# Patient Record
Sex: Male | Born: 1970 | Race: White
Health system: Southern US, Community
[De-identification: ages and names within clinical notes are randomized; demographics above are authoritative.]

---

## 2018-03-20 DIAGNOSIS — M19072 Primary osteoarthritis, left ankle and foot: Secondary | ICD-10-CM | POA: Diagnosis not present

## 2018-03-25 ENCOUNTER — Other Ambulatory Visit: Payer: Self-pay | Admitting: Orthopaedic Surgery

## 2018-03-25 DIAGNOSIS — M19072 Primary osteoarthritis, left ankle and foot: Secondary | ICD-10-CM

## 2018-03-29 ENCOUNTER — Ambulatory Visit
Admission: RE | Admit: 2018-03-29 | Discharge: 2018-03-29 | Disposition: A | Discharge status: Home / Self Care | Payer: BLUE CROSS/BLUE SHIELD | Source: Ambulatory Visit | Attending: Orthopaedic Surgery | Admitting: Orthopaedic Surgery

## 2018-03-29 DIAGNOSIS — M19072 Primary osteoarthritis, left ankle and foot: Secondary | ICD-10-CM | POA: Diagnosis not present

## 2018-04-15 DIAGNOSIS — M19072 Primary osteoarthritis, left ankle and foot: Secondary | ICD-10-CM | POA: Diagnosis not present

## 2019-01-02 IMAGING — CT CT ANKLE*L* W/O CM
1 series · 12 of 14 positions shown, 15 images · non-contrast
Comparison: None.

CLINICAL DATA: Left ankle pain for 3 years. No known injury. The
patient reports history of surgery for bone spurs and tendon repair
in 7469.

EXAM:
CT OF THE LEFT ANKLE WITHOUT CONTRAST
TECHNIQUE: Multidetector CT imaging of the left ankle was performed according
to the standard protocol. Multiplanar CT image reconstructions were
also generated.

[Series 4: soft tissue lower extremity · axial · 0.34mm/px · z∈[+36,+186]mm · 12 of 89 slices shown, 15 images]
[im 7/89  soft-tissue]
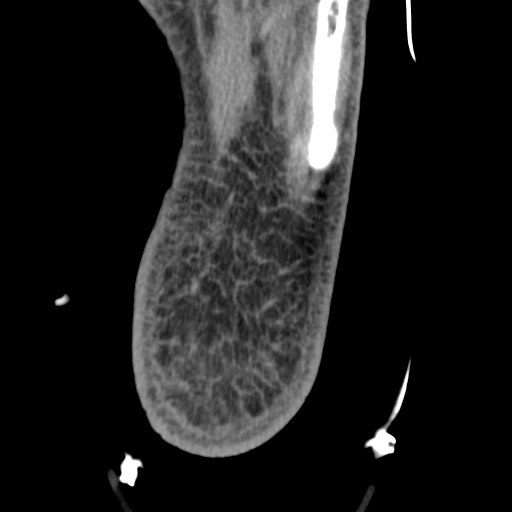
[im 7/89  bone]
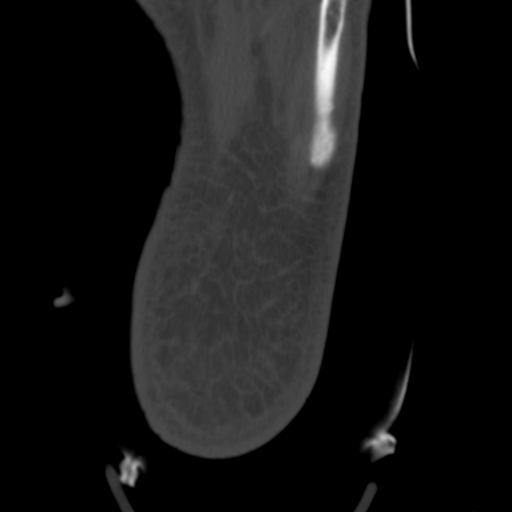
[im 14/89  bone]
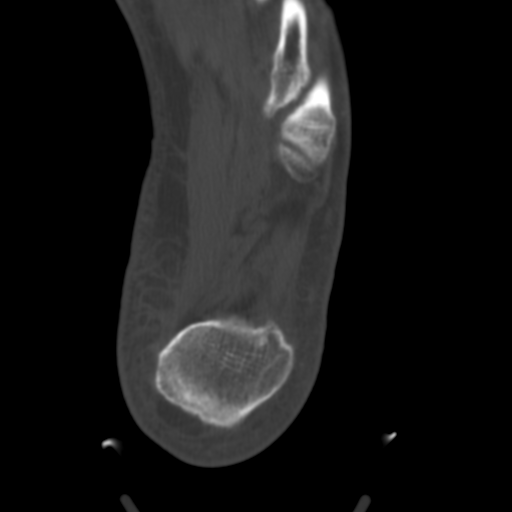
[im 21/89  bone]
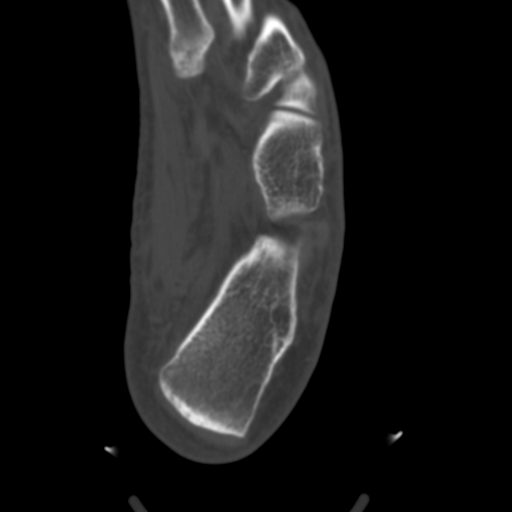
[im 28/89  bone]
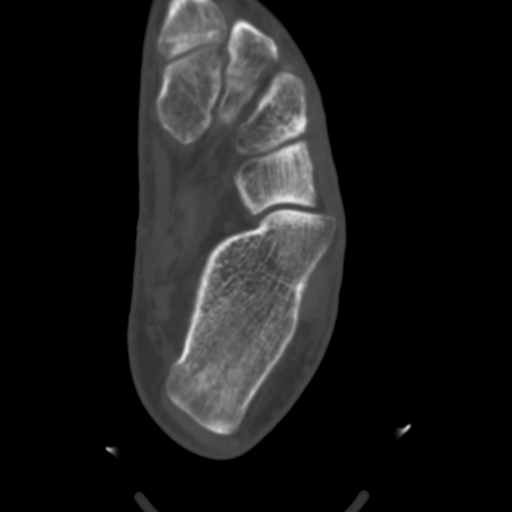
[im 34/89  soft-tissue]
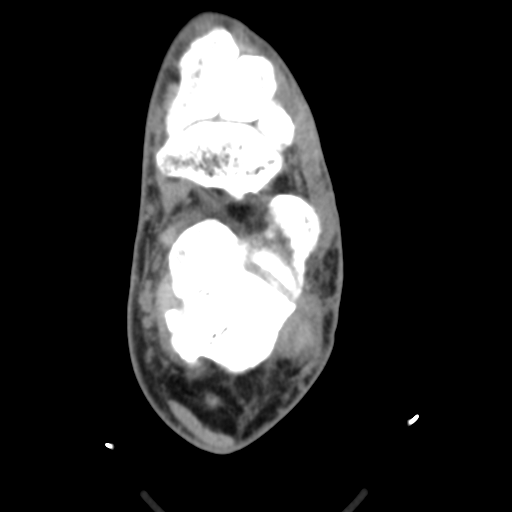
[im 34/89  bone]
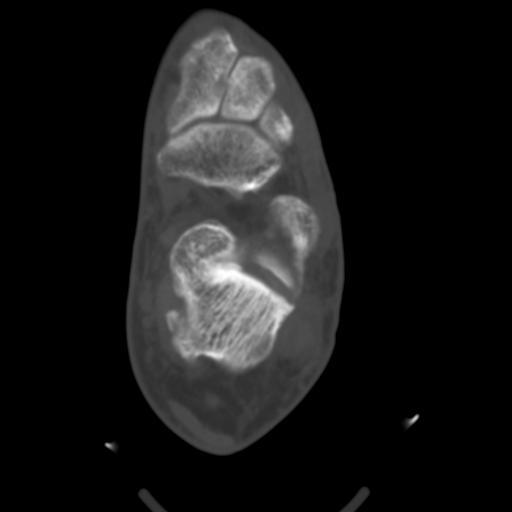
[im 41/89  bone]
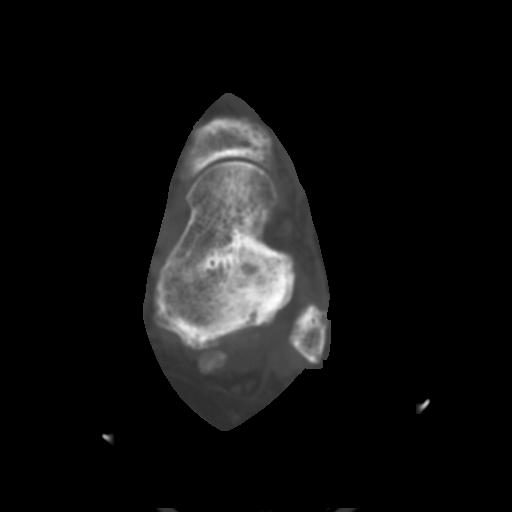
[im 48/89  bone]
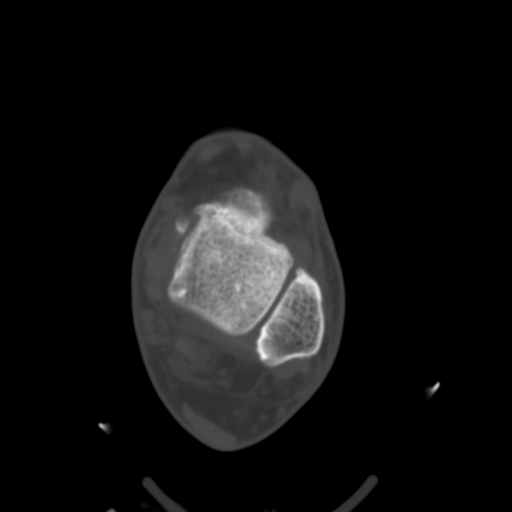
[im 55/89  bone]
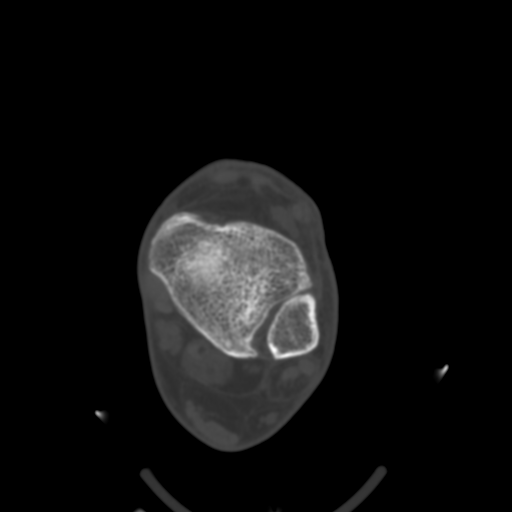
[im 61/89  soft-tissue]
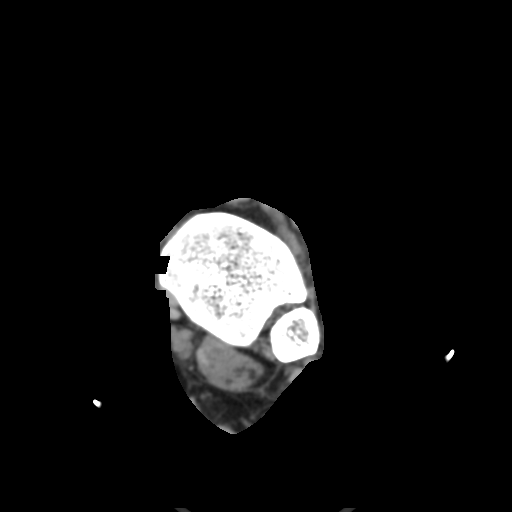
[im 61/89  bone]
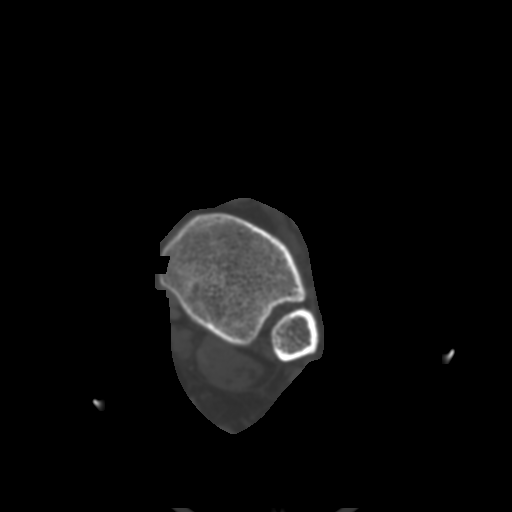
[im 68/89  bone]
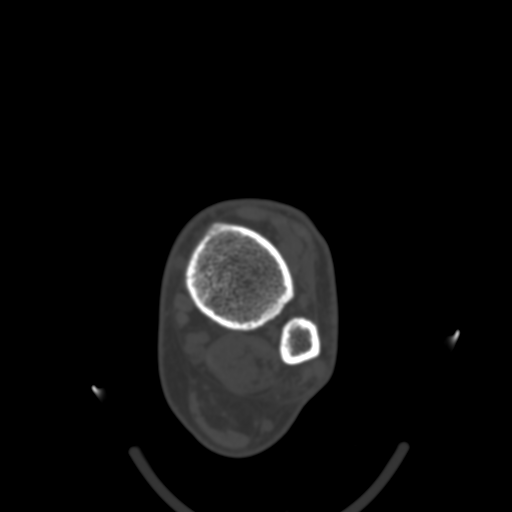
[im 75/89  bone]
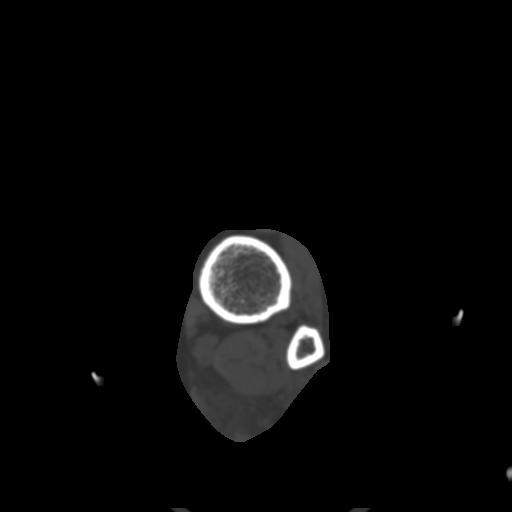
[im 82/89  bone]
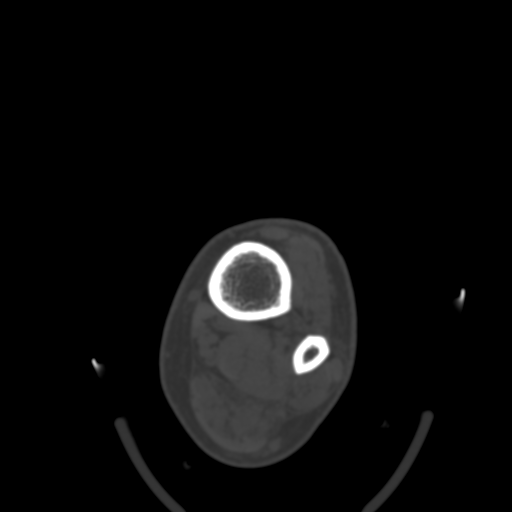

[12 of 14 positions shown; findings below may reference images not displayed]

FINDINGS: Bones/Joint/Cartilage

No acute bony or joint abnormality is seen. The patient has an os
trigonum which measures up to 1 cm AP x 0.6 cm craniocaudal x 2.6 cm
transverse. Medial to the os, the dorsal process of the calcaneus is
atypically prominent. The patient's os trigonum and prominent dorsal
process of the talus articulate with an exostosis off the dorsal
margin of the calcaneus.

The patient has degenerative disease at the posterior facet of the
subtalar joint where there is joint space narrowing, subchondral
sclerosis and a few small subchondral cysts in the talar side of the
posterior facet.

Minimal osteophytosis is seen about the tibiotalar joint. A small
spur of the calcaneus at the Achilles tendon insertion is also
noted. No osteochondral lesion of the talar dome is identified.
There is no tarsal coalition.

Ligaments

Suboptimally assessed by CT.  Appear intact.

Muscles and Tendons

The peroneus longus tendon appears thickened posterior to the
lateral malleolus with hypoattenuation within the mid substance of
the tendon as it passes subjacent to the lateral malleolus worrisome
for longitudinal split tear. Tendons are otherwise unremarkable.

Soft tissues

No fluid collection or mass.
IMPRESSION: Os trigonum and prominent dorsal medial process of the talus
articulate with a small exostosis off the calcaneus. This could be a
source of posterior ankle pain also raise the possibility of
posterior ankle impingement.

Findings worrisome for longitudinal split tear of the peroneus
longus as it passes beneath the distal fibula.

Moderate osteoarthritis posterior facet of the subtalar joint.

## 2019-02-11 DIAGNOSIS — M25572 Pain in left ankle and joints of left foot: Secondary | ICD-10-CM | POA: Diagnosis not present

## 2019-02-11 DIAGNOSIS — Z13220 Encounter for screening for lipoid disorders: Secondary | ICD-10-CM | POA: Diagnosis not present

## 2019-02-11 DIAGNOSIS — M1009 Idiopathic gout, multiple sites: Secondary | ICD-10-CM | POA: Diagnosis not present

## 2019-02-11 DIAGNOSIS — Z23 Encounter for immunization: Secondary | ICD-10-CM | POA: Diagnosis not present

## 2019-02-11 DIAGNOSIS — Z0000 Encounter for general adult medical examination without abnormal findings: Secondary | ICD-10-CM | POA: Diagnosis not present

## 2019-08-19 DIAGNOSIS — D1801 Hemangioma of skin and subcutaneous tissue: Secondary | ICD-10-CM | POA: Diagnosis not present

## 2019-08-19 DIAGNOSIS — D225 Melanocytic nevi of trunk: Secondary | ICD-10-CM | POA: Diagnosis not present

## 2019-08-19 DIAGNOSIS — L821 Other seborrheic keratosis: Secondary | ICD-10-CM | POA: Diagnosis not present

## 2019-08-19 DIAGNOSIS — L218 Other seborrheic dermatitis: Secondary | ICD-10-CM | POA: Diagnosis not present

## 2019-10-20 DIAGNOSIS — J029 Acute pharyngitis, unspecified: Secondary | ICD-10-CM | POA: Diagnosis not present

## 2019-10-20 DIAGNOSIS — H40003 Preglaucoma, unspecified, bilateral: Secondary | ICD-10-CM | POA: Diagnosis not present

## 2019-11-11 DIAGNOSIS — R634 Abnormal weight loss: Secondary | ICD-10-CM | POA: Diagnosis not present

## 2019-11-11 DIAGNOSIS — Z23 Encounter for immunization: Secondary | ICD-10-CM | POA: Diagnosis not present

## 2019-11-11 DIAGNOSIS — K120 Recurrent oral aphthae: Secondary | ICD-10-CM | POA: Diagnosis not present

## 2019-12-08 DIAGNOSIS — J069 Acute upper respiratory infection, unspecified: Secondary | ICD-10-CM | POA: Diagnosis not present

## 2019-12-09 DIAGNOSIS — R05 Cough: Secondary | ICD-10-CM | POA: Diagnosis not present

## 2019-12-09 DIAGNOSIS — Z03818 Encounter for observation for suspected exposure to other biological agents ruled out: Secondary | ICD-10-CM | POA: Diagnosis not present

## 2019-12-24 DIAGNOSIS — J0110 Acute frontal sinusitis, unspecified: Secondary | ICD-10-CM | POA: Diagnosis not present

## 2020-02-04 DIAGNOSIS — J329 Chronic sinusitis, unspecified: Secondary | ICD-10-CM | POA: Diagnosis not present

## 2020-02-10 ENCOUNTER — Ambulatory Visit: Payer: BLUE CROSS/BLUE SHIELD | Admitting: Sports Medicine

## 2020-02-11 ENCOUNTER — Ambulatory Visit: Payer: Self-pay

## 2020-02-11 ENCOUNTER — Other Ambulatory Visit: Payer: Self-pay

## 2020-02-11 ENCOUNTER — Ambulatory Visit (INDEPENDENT_AMBULATORY_CARE_PROVIDER_SITE_OTHER): Payer: BC Managed Care – PPO | Admitting: Family Medicine

## 2020-02-11 VITALS — BP 118/80 | Ht 75.0 in | Wt 210.0 lb

## 2020-02-11 DIAGNOSIS — M25572 Pain in left ankle and joints of left foot: Secondary | ICD-10-CM

## 2020-02-11 DIAGNOSIS — G8929 Other chronic pain: Secondary | ICD-10-CM

## 2020-02-11 NOTE — Progress Notes (Addendum)
SUBJECTIVE:   CHIEF COMPLAINT / HPI:   Left Ankle/Foot Pain [name] is a very pleasant 49 year old male who is a new patient to this clinic who presents today for chronic lateral ankle pain.  He states this began many years ago when he first lived in [place] he was found to have calcifications and tears in his peroneal tendons.  He had this surgically repaired at that time and was quickly released to return to running by the physician in [place] in 2017.  He thought perhaps he returned to running too soon and did do his own physical therapy and rest with some minor improvement.  He moved to [place] in 2019 and his symptoms returned so he did get an appointment to be seen by Dr. Susa Simmonds at [facility] orthopedics about 1 year ago.  He said he got some imaging and per his remembrance with his discussion with Dr. Darlyn Chamber he was given the option to try some orthotics versus undergo a surgical procedure which would involve fusion of 2 bones.  He never heard back from [facility] orthopedics on when to come in for orthotics so he contacted his PCP Dr. Chanetta Marshall and she referred him to [place].  Today he states the lateral ankle pain is not on the bone but behind the bone and radiates somewhat up the back of the leg and is worse in the morning or when he let his foot hanging down.  It is intermittent and comes and goes but does seem to get worse with running and activity.  He states that if he plays soccer with his kids in the yard he knows he will have to "pay for it" later on.  He has been doing some rowing to stay active and as this puts less stress on his ankle he can tolerate it more.  PERTINENT  PMH / PSH: History of left peroneal tendon surgery  OBJECTIVE:   BP 118/80   Ht 6\' 3"  (1.905 m)   Wt 210 lb (95.3 kg)   BMI 26.25 kg/m   [facility] 02/11/2020  Frequency of aerobic exercise (# of days/week) 5  Average time in minutes 45  Frequency of strengthening  activities (# of days/week) 3   Ankle/Foot, Left: No visible erythema, swelling, ecchymosis, or bony deformity. No notable pes planus deformity. Transverse arch grossly intact; No evidence of tibiotalar deviation; Range of motion is full in all directions but does have stiffness and mild pain with ankle inversion. Strength is 5/5 in all directions. No tenderness at the insertion of the Achilles tendon; Mild peroneal tendon tenderness to palpation but no subluxation; Stable lateral and medial ligaments;Talar dome nontender; Unremarkable calcaneal squeeze; No tenderness at the distal metatarsals; Negative tarsal tunnel tinel's; Able to walk 4 steps.  Special Tests:   - Anterior Drawer test: NEG   - Talar Tilt test: NEG   - Syndesmotic Squeeze test: NEG  Limited [facility] ultrasound left ankle Peroneus brevis and longus both viewed in short and long axis.  It had evidence of hypoechoic changes and surrounding fluid within the tendon sheath suggestive of tenosynovitis.  Small hyper echoic density seen surrounding the peroneus brevis just proximal to the insertion at the base of the fifth metatarsal.  Indicating calcific changes.  No fiber disruption or stump sign present. 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   ASSESSMENT/PLAN:   Left ankle pain Patient with  chronic lateral ankle pain status post peroneus tendon surgery from 2017.  His history, clinical presentation, and ultrasound results today consistent with chronic tendinopathy of the peroneus tendons.  No evidence of tearing but he does have some evidence of swelling surrounding the tendon within the sheath.  We will begin treatment conservatively and patient was given green insoles with scaphoid pads to see if orthotics would be of a possible benefit to him.  Also gave him home exercises to strengthen the peroneus tendons and also some range  of motion exercises for the ankle.  I will have him follow-up in 2 weeks to discuss next steps.  If he gets a benefit from the green insoles we can do orthotics or he can decide to keep those if he is happy with them.  If he gets better with neither we could go with another session of formal physical therapy.  Icing, OTC nsaids or tylenol if needed.    Arlyce Harman, DO PGY-4, Sports Medicine Fellow [facility]  Addendum:  Patient seen in the office by fellow.  His history, exam, plan of care were precepted with me.  Norton Blizzard MD Marrianne Mood

## 2020-02-11 NOTE — Assessment & Plan Note (Addendum)
Patient with chronic lateral ankle pain status post peroneus tendon surgery from 2017.  Given his history, clinical presentation, and ultrasound results today he most likely has chronic tendinopathy of the peroneus tendon.  No evidence of tearing but he does have some evidence of swelling surrounding the tendon within the sheath.  We will begin treatment conservatively and patient was given green insoles with scaphoid pads to see if orthotics would be of a possible benefit to him.  Also gave him home exercises to strengthen the peroneus tendons and also some range of motion exercises for the ankle.  I will have him follow-up in 2 weeks to discuss next steps.  If he gets a benefit from the green insoles we can do orthotics or he can decide to keep those if he is happy with them.  If he gets better with neither we could go with another session of formal physical therapy.

## 2020-02-16 DIAGNOSIS — M1009 Idiopathic gout, multiple sites: Secondary | ICD-10-CM | POA: Diagnosis not present

## 2020-02-16 DIAGNOSIS — E785 Hyperlipidemia, unspecified: Secondary | ICD-10-CM | POA: Diagnosis not present

## 2020-02-16 DIAGNOSIS — Z0000 Encounter for general adult medical examination without abnormal findings: Secondary | ICD-10-CM | POA: Diagnosis not present

## 2020-02-18 DIAGNOSIS — D1801 Hemangioma of skin and subcutaneous tissue: Secondary | ICD-10-CM | POA: Diagnosis not present

## 2020-02-18 DIAGNOSIS — Z872 Personal history of diseases of the skin and subcutaneous tissue: Secondary | ICD-10-CM | POA: Diagnosis not present

## 2020-02-18 DIAGNOSIS — L905 Scar conditions and fibrosis of skin: Secondary | ICD-10-CM | POA: Diagnosis not present

## 2020-02-18 DIAGNOSIS — L218 Other seborrheic dermatitis: Secondary | ICD-10-CM | POA: Diagnosis not present

## 2020-03-01 DIAGNOSIS — J309 Allergic rhinitis, unspecified: Secondary | ICD-10-CM | POA: Diagnosis not present

## 2020-03-01 DIAGNOSIS — J329 Chronic sinusitis, unspecified: Secondary | ICD-10-CM | POA: Diagnosis not present

## 2020-03-01 DIAGNOSIS — J3489 Other specified disorders of nose and nasal sinuses: Secondary | ICD-10-CM | POA: Diagnosis not present

## 2020-03-25 DIAGNOSIS — Z01818 Encounter for other preprocedural examination: Secondary | ICD-10-CM | POA: Diagnosis not present

## 2020-03-30 DIAGNOSIS — R040 Epistaxis: Secondary | ICD-10-CM | POA: Diagnosis not present

## 2020-03-30 DIAGNOSIS — J301 Allergic rhinitis due to pollen: Secondary | ICD-10-CM | POA: Diagnosis not present

## 2020-03-30 DIAGNOSIS — J3089 Other allergic rhinitis: Secondary | ICD-10-CM | POA: Diagnosis not present

## 2020-03-31 DIAGNOSIS — J301 Allergic rhinitis due to pollen: Secondary | ICD-10-CM | POA: Diagnosis not present

## 2020-04-01 DIAGNOSIS — J3089 Other allergic rhinitis: Secondary | ICD-10-CM | POA: Diagnosis not present

## 2020-04-20 DIAGNOSIS — J301 Allergic rhinitis due to pollen: Secondary | ICD-10-CM | POA: Diagnosis not present

## 2020-04-20 DIAGNOSIS — J3089 Other allergic rhinitis: Secondary | ICD-10-CM | POA: Diagnosis not present

## 2020-04-22 DIAGNOSIS — J301 Allergic rhinitis due to pollen: Secondary | ICD-10-CM | POA: Diagnosis not present

## 2020-04-22 DIAGNOSIS — J3089 Other allergic rhinitis: Secondary | ICD-10-CM | POA: Diagnosis not present

## 2020-04-26 DIAGNOSIS — J301 Allergic rhinitis due to pollen: Secondary | ICD-10-CM | POA: Diagnosis not present

## 2020-04-26 DIAGNOSIS — J3089 Other allergic rhinitis: Secondary | ICD-10-CM | POA: Diagnosis not present

## 2020-04-28 DIAGNOSIS — J301 Allergic rhinitis due to pollen: Secondary | ICD-10-CM | POA: Diagnosis not present

## 2020-04-28 DIAGNOSIS — J3089 Other allergic rhinitis: Secondary | ICD-10-CM | POA: Diagnosis not present

## 2020-04-30 DIAGNOSIS — J301 Allergic rhinitis due to pollen: Secondary | ICD-10-CM | POA: Diagnosis not present

## 2020-04-30 DIAGNOSIS — J3089 Other allergic rhinitis: Secondary | ICD-10-CM | POA: Diagnosis not present

## 2020-05-03 DIAGNOSIS — J301 Allergic rhinitis due to pollen: Secondary | ICD-10-CM | POA: Diagnosis not present

## 2020-05-03 DIAGNOSIS — J3089 Other allergic rhinitis: Secondary | ICD-10-CM | POA: Diagnosis not present

## 2020-05-05 DIAGNOSIS — J3089 Other allergic rhinitis: Secondary | ICD-10-CM | POA: Diagnosis not present

## 2020-05-05 DIAGNOSIS — J301 Allergic rhinitis due to pollen: Secondary | ICD-10-CM | POA: Diagnosis not present

## 2020-05-07 DIAGNOSIS — J3081 Allergic rhinitis due to animal (cat) (dog) hair and dander: Secondary | ICD-10-CM | POA: Diagnosis not present

## 2020-05-07 DIAGNOSIS — J301 Allergic rhinitis due to pollen: Secondary | ICD-10-CM | POA: Diagnosis not present

## 2020-05-07 DIAGNOSIS — J3089 Other allergic rhinitis: Secondary | ICD-10-CM | POA: Diagnosis not present

## 2020-05-11 DIAGNOSIS — J301 Allergic rhinitis due to pollen: Secondary | ICD-10-CM | POA: Diagnosis not present

## 2020-05-11 DIAGNOSIS — J3089 Other allergic rhinitis: Secondary | ICD-10-CM | POA: Diagnosis not present

## 2020-05-13 DIAGNOSIS — J301 Allergic rhinitis due to pollen: Secondary | ICD-10-CM | POA: Diagnosis not present

## 2020-05-13 DIAGNOSIS — J3089 Other allergic rhinitis: Secondary | ICD-10-CM | POA: Diagnosis not present

## 2020-05-17 DIAGNOSIS — J301 Allergic rhinitis due to pollen: Secondary | ICD-10-CM | POA: Diagnosis not present

## 2020-05-17 DIAGNOSIS — J3089 Other allergic rhinitis: Secondary | ICD-10-CM | POA: Diagnosis not present

## 2020-05-19 DIAGNOSIS — J3089 Other allergic rhinitis: Secondary | ICD-10-CM | POA: Diagnosis not present

## 2020-05-19 DIAGNOSIS — J301 Allergic rhinitis due to pollen: Secondary | ICD-10-CM | POA: Diagnosis not present

## 2020-05-19 DIAGNOSIS — J3081 Allergic rhinitis due to animal (cat) (dog) hair and dander: Secondary | ICD-10-CM | POA: Diagnosis not present

## 2020-05-21 DIAGNOSIS — J3089 Other allergic rhinitis: Secondary | ICD-10-CM | POA: Diagnosis not present

## 2020-05-21 DIAGNOSIS — J301 Allergic rhinitis due to pollen: Secondary | ICD-10-CM | POA: Diagnosis not present

## 2020-05-24 DIAGNOSIS — J3089 Other allergic rhinitis: Secondary | ICD-10-CM | POA: Diagnosis not present

## 2020-05-24 DIAGNOSIS — J301 Allergic rhinitis due to pollen: Secondary | ICD-10-CM | POA: Diagnosis not present

## 2020-05-26 DIAGNOSIS — J301 Allergic rhinitis due to pollen: Secondary | ICD-10-CM | POA: Diagnosis not present

## 2020-05-26 DIAGNOSIS — J3089 Other allergic rhinitis: Secondary | ICD-10-CM | POA: Diagnosis not present

## 2020-05-31 DIAGNOSIS — J3089 Other allergic rhinitis: Secondary | ICD-10-CM | POA: Diagnosis not present

## 2020-05-31 DIAGNOSIS — Z01812 Encounter for preprocedural laboratory examination: Secondary | ICD-10-CM | POA: Diagnosis not present

## 2020-05-31 DIAGNOSIS — J301 Allergic rhinitis due to pollen: Secondary | ICD-10-CM | POA: Diagnosis not present

## 2020-06-03 DIAGNOSIS — K648 Other hemorrhoids: Secondary | ICD-10-CM | POA: Diagnosis not present

## 2020-06-03 DIAGNOSIS — K621 Rectal polyp: Secondary | ICD-10-CM | POA: Diagnosis not present

## 2020-06-03 DIAGNOSIS — Z1211 Encounter for screening for malignant neoplasm of colon: Secondary | ICD-10-CM | POA: Diagnosis not present

## 2020-06-03 DIAGNOSIS — K635 Polyp of colon: Secondary | ICD-10-CM | POA: Diagnosis not present

## 2020-06-03 DIAGNOSIS — D127 Benign neoplasm of rectosigmoid junction: Secondary | ICD-10-CM | POA: Diagnosis not present

## 2020-06-03 DIAGNOSIS — D123 Benign neoplasm of transverse colon: Secondary | ICD-10-CM | POA: Diagnosis not present

## 2020-06-04 DIAGNOSIS — J3089 Other allergic rhinitis: Secondary | ICD-10-CM | POA: Diagnosis not present

## 2020-06-04 DIAGNOSIS — J301 Allergic rhinitis due to pollen: Secondary | ICD-10-CM | POA: Diagnosis not present

## 2020-06-07 DIAGNOSIS — J3089 Other allergic rhinitis: Secondary | ICD-10-CM | POA: Diagnosis not present

## 2020-06-07 DIAGNOSIS — J301 Allergic rhinitis due to pollen: Secondary | ICD-10-CM | POA: Diagnosis not present

## 2020-06-11 DIAGNOSIS — J3089 Other allergic rhinitis: Secondary | ICD-10-CM | POA: Diagnosis not present

## 2020-06-11 DIAGNOSIS — J301 Allergic rhinitis due to pollen: Secondary | ICD-10-CM | POA: Diagnosis not present

## 2020-06-15 DIAGNOSIS — J3089 Other allergic rhinitis: Secondary | ICD-10-CM | POA: Diagnosis not present

## 2020-06-15 DIAGNOSIS — J301 Allergic rhinitis due to pollen: Secondary | ICD-10-CM | POA: Diagnosis not present

## 2020-06-17 DIAGNOSIS — J3081 Allergic rhinitis due to animal (cat) (dog) hair and dander: Secondary | ICD-10-CM | POA: Diagnosis not present

## 2020-06-17 DIAGNOSIS — J3089 Other allergic rhinitis: Secondary | ICD-10-CM | POA: Diagnosis not present

## 2020-06-17 DIAGNOSIS — J301 Allergic rhinitis due to pollen: Secondary | ICD-10-CM | POA: Diagnosis not present

## 2020-06-21 DIAGNOSIS — J3089 Other allergic rhinitis: Secondary | ICD-10-CM | POA: Diagnosis not present

## 2020-06-21 DIAGNOSIS — J301 Allergic rhinitis due to pollen: Secondary | ICD-10-CM | POA: Diagnosis not present

## 2020-06-29 DIAGNOSIS — J3089 Other allergic rhinitis: Secondary | ICD-10-CM | POA: Diagnosis not present

## 2020-06-29 DIAGNOSIS — J301 Allergic rhinitis due to pollen: Secondary | ICD-10-CM | POA: Diagnosis not present

## 2020-07-07 DIAGNOSIS — J301 Allergic rhinitis due to pollen: Secondary | ICD-10-CM | POA: Diagnosis not present

## 2020-07-07 DIAGNOSIS — J3089 Other allergic rhinitis: Secondary | ICD-10-CM | POA: Diagnosis not present

## 2020-07-16 DIAGNOSIS — J3089 Other allergic rhinitis: Secondary | ICD-10-CM | POA: Diagnosis not present

## 2020-07-16 DIAGNOSIS — J301 Allergic rhinitis due to pollen: Secondary | ICD-10-CM | POA: Diagnosis not present

## 2020-07-23 DIAGNOSIS — J301 Allergic rhinitis due to pollen: Secondary | ICD-10-CM | POA: Diagnosis not present

## 2020-07-23 DIAGNOSIS — J3089 Other allergic rhinitis: Secondary | ICD-10-CM | POA: Diagnosis not present

## 2020-08-05 DIAGNOSIS — J301 Allergic rhinitis due to pollen: Secondary | ICD-10-CM | POA: Diagnosis not present

## 2020-08-05 DIAGNOSIS — J3089 Other allergic rhinitis: Secondary | ICD-10-CM | POA: Diagnosis not present

## 2020-08-18 DIAGNOSIS — J3089 Other allergic rhinitis: Secondary | ICD-10-CM | POA: Diagnosis not present

## 2020-08-18 DIAGNOSIS — J3081 Allergic rhinitis due to animal (cat) (dog) hair and dander: Secondary | ICD-10-CM | POA: Diagnosis not present

## 2020-08-18 DIAGNOSIS — J301 Allergic rhinitis due to pollen: Secondary | ICD-10-CM | POA: Diagnosis not present

## 2020-08-25 DIAGNOSIS — J3089 Other allergic rhinitis: Secondary | ICD-10-CM | POA: Diagnosis not present

## 2020-08-25 DIAGNOSIS — J301 Allergic rhinitis due to pollen: Secondary | ICD-10-CM | POA: Diagnosis not present

## 2020-08-31 DIAGNOSIS — J301 Allergic rhinitis due to pollen: Secondary | ICD-10-CM | POA: Diagnosis not present

## 2020-08-31 DIAGNOSIS — J3089 Other allergic rhinitis: Secondary | ICD-10-CM | POA: Diagnosis not present

## 2020-09-06 DIAGNOSIS — Z1159 Encounter for screening for other viral diseases: Secondary | ICD-10-CM | POA: Diagnosis not present

## 2020-09-07 DIAGNOSIS — J301 Allergic rhinitis due to pollen: Secondary | ICD-10-CM | POA: Diagnosis not present

## 2020-09-07 DIAGNOSIS — J3089 Other allergic rhinitis: Secondary | ICD-10-CM | POA: Diagnosis not present

## 2020-09-15 DIAGNOSIS — J301 Allergic rhinitis due to pollen: Secondary | ICD-10-CM | POA: Diagnosis not present

## 2020-09-15 DIAGNOSIS — J3089 Other allergic rhinitis: Secondary | ICD-10-CM | POA: Diagnosis not present

## 2020-09-28 DIAGNOSIS — J3089 Other allergic rhinitis: Secondary | ICD-10-CM | POA: Diagnosis not present

## 2020-09-28 DIAGNOSIS — J301 Allergic rhinitis due to pollen: Secondary | ICD-10-CM | POA: Diagnosis not present

## 2020-10-05 DIAGNOSIS — J301 Allergic rhinitis due to pollen: Secondary | ICD-10-CM | POA: Diagnosis not present

## 2020-10-05 DIAGNOSIS — J3089 Other allergic rhinitis: Secondary | ICD-10-CM | POA: Diagnosis not present

## 2020-10-08 DIAGNOSIS — H66002 Acute suppurative otitis media without spontaneous rupture of ear drum, left ear: Secondary | ICD-10-CM | POA: Diagnosis not present

## 2020-10-20 DIAGNOSIS — H40013 Open angle with borderline findings, low risk, bilateral: Secondary | ICD-10-CM | POA: Diagnosis not present

## 2020-10-22 DIAGNOSIS — J301 Allergic rhinitis due to pollen: Secondary | ICD-10-CM | POA: Diagnosis not present

## 2020-10-22 DIAGNOSIS — J3089 Other allergic rhinitis: Secondary | ICD-10-CM | POA: Diagnosis not present

## 2020-10-29 DIAGNOSIS — J3089 Other allergic rhinitis: Secondary | ICD-10-CM | POA: Diagnosis not present

## 2020-10-29 DIAGNOSIS — R040 Epistaxis: Secondary | ICD-10-CM | POA: Diagnosis not present

## 2020-10-29 DIAGNOSIS — J301 Allergic rhinitis due to pollen: Secondary | ICD-10-CM | POA: Diagnosis not present

## 2020-11-01 DIAGNOSIS — J3089 Other allergic rhinitis: Secondary | ICD-10-CM | POA: Diagnosis not present

## 2020-11-01 DIAGNOSIS — J301 Allergic rhinitis due to pollen: Secondary | ICD-10-CM | POA: Diagnosis not present

## 2020-11-03 DIAGNOSIS — J3089 Other allergic rhinitis: Secondary | ICD-10-CM | POA: Diagnosis not present

## 2020-11-03 DIAGNOSIS — J301 Allergic rhinitis due to pollen: Secondary | ICD-10-CM | POA: Diagnosis not present

## 2020-11-10 DIAGNOSIS — J3089 Other allergic rhinitis: Secondary | ICD-10-CM | POA: Diagnosis not present

## 2020-11-10 DIAGNOSIS — J301 Allergic rhinitis due to pollen: Secondary | ICD-10-CM | POA: Diagnosis not present

## 2020-11-16 DIAGNOSIS — M1009 Idiopathic gout, multiple sites: Secondary | ICD-10-CM | POA: Diagnosis not present

## 2020-11-16 DIAGNOSIS — R55 Syncope and collapse: Secondary | ICD-10-CM | POA: Diagnosis not present

## 2020-11-16 DIAGNOSIS — J301 Allergic rhinitis due to pollen: Secondary | ICD-10-CM | POA: Diagnosis not present

## 2020-11-16 DIAGNOSIS — J3089 Other allergic rhinitis: Secondary | ICD-10-CM | POA: Diagnosis not present

## 2020-11-30 DIAGNOSIS — J301 Allergic rhinitis due to pollen: Secondary | ICD-10-CM | POA: Diagnosis not present

## 2020-11-30 DIAGNOSIS — J3081 Allergic rhinitis due to animal (cat) (dog) hair and dander: Secondary | ICD-10-CM | POA: Diagnosis not present

## 2020-11-30 DIAGNOSIS — J3089 Other allergic rhinitis: Secondary | ICD-10-CM | POA: Diagnosis not present

## 2020-12-09 DIAGNOSIS — J3089 Other allergic rhinitis: Secondary | ICD-10-CM | POA: Diagnosis not present

## 2020-12-09 DIAGNOSIS — J301 Allergic rhinitis due to pollen: Secondary | ICD-10-CM | POA: Diagnosis not present

## 2020-12-10 DIAGNOSIS — J301 Allergic rhinitis due to pollen: Secondary | ICD-10-CM | POA: Diagnosis not present

## 2020-12-10 DIAGNOSIS — J3089 Other allergic rhinitis: Secondary | ICD-10-CM | POA: Diagnosis not present

## 2020-12-15 DIAGNOSIS — J301 Allergic rhinitis due to pollen: Secondary | ICD-10-CM | POA: Diagnosis not present

## 2020-12-15 DIAGNOSIS — J3089 Other allergic rhinitis: Secondary | ICD-10-CM | POA: Diagnosis not present

## 2020-12-21 DIAGNOSIS — J3089 Other allergic rhinitis: Secondary | ICD-10-CM | POA: Diagnosis not present

## 2020-12-21 DIAGNOSIS — J301 Allergic rhinitis due to pollen: Secondary | ICD-10-CM | POA: Diagnosis not present

## 2020-12-30 DIAGNOSIS — J3089 Other allergic rhinitis: Secondary | ICD-10-CM | POA: Diagnosis not present

## 2020-12-30 DIAGNOSIS — J301 Allergic rhinitis due to pollen: Secondary | ICD-10-CM | POA: Diagnosis not present

## 2021-01-03 DIAGNOSIS — J3089 Other allergic rhinitis: Secondary | ICD-10-CM | POA: Diagnosis not present

## 2021-01-03 DIAGNOSIS — J301 Allergic rhinitis due to pollen: Secondary | ICD-10-CM | POA: Diagnosis not present

## 2021-01-05 DIAGNOSIS — J301 Allergic rhinitis due to pollen: Secondary | ICD-10-CM | POA: Diagnosis not present

## 2021-01-05 DIAGNOSIS — J3089 Other allergic rhinitis: Secondary | ICD-10-CM | POA: Diagnosis not present

## 2021-01-05 DIAGNOSIS — M1009 Idiopathic gout, multiple sites: Secondary | ICD-10-CM | POA: Diagnosis not present

## 2021-01-05 DIAGNOSIS — Z23 Encounter for immunization: Secondary | ICD-10-CM | POA: Diagnosis not present

## 2021-01-07 DIAGNOSIS — J3089 Other allergic rhinitis: Secondary | ICD-10-CM | POA: Diagnosis not present

## 2021-01-07 DIAGNOSIS — J301 Allergic rhinitis due to pollen: Secondary | ICD-10-CM | POA: Diagnosis not present

## 2021-01-10 DIAGNOSIS — J3089 Other allergic rhinitis: Secondary | ICD-10-CM | POA: Diagnosis not present

## 2021-01-10 DIAGNOSIS — J301 Allergic rhinitis due to pollen: Secondary | ICD-10-CM | POA: Diagnosis not present

## 2021-01-12 DIAGNOSIS — J3089 Other allergic rhinitis: Secondary | ICD-10-CM | POA: Diagnosis not present

## 2021-01-12 DIAGNOSIS — J301 Allergic rhinitis due to pollen: Secondary | ICD-10-CM | POA: Diagnosis not present

## 2021-01-24 DIAGNOSIS — J301 Allergic rhinitis due to pollen: Secondary | ICD-10-CM | POA: Diagnosis not present

## 2021-01-24 DIAGNOSIS — J3089 Other allergic rhinitis: Secondary | ICD-10-CM | POA: Diagnosis not present

## 2021-01-31 DIAGNOSIS — J301 Allergic rhinitis due to pollen: Secondary | ICD-10-CM | POA: Diagnosis not present

## 2021-01-31 DIAGNOSIS — J3089 Other allergic rhinitis: Secondary | ICD-10-CM | POA: Diagnosis not present

## 2021-01-31 DIAGNOSIS — J3081 Allergic rhinitis due to animal (cat) (dog) hair and dander: Secondary | ICD-10-CM | POA: Diagnosis not present

## 2021-02-11 DIAGNOSIS — J3089 Other allergic rhinitis: Secondary | ICD-10-CM | POA: Diagnosis not present

## 2021-02-11 DIAGNOSIS — J301 Allergic rhinitis due to pollen: Secondary | ICD-10-CM | POA: Diagnosis not present

## 2021-02-21 DIAGNOSIS — J3089 Other allergic rhinitis: Secondary | ICD-10-CM | POA: Diagnosis not present

## 2021-02-21 DIAGNOSIS — J301 Allergic rhinitis due to pollen: Secondary | ICD-10-CM | POA: Diagnosis not present

## 2021-03-02 DIAGNOSIS — M1009 Idiopathic gout, multiple sites: Secondary | ICD-10-CM | POA: Diagnosis not present

## 2021-03-02 DIAGNOSIS — M25572 Pain in left ankle and joints of left foot: Secondary | ICD-10-CM | POA: Diagnosis not present

## 2021-03-02 DIAGNOSIS — Z0000 Encounter for general adult medical examination without abnormal findings: Secondary | ICD-10-CM | POA: Diagnosis not present

## 2021-03-02 DIAGNOSIS — Z125 Encounter for screening for malignant neoplasm of prostate: Secondary | ICD-10-CM | POA: Diagnosis not present

## 2021-03-02 DIAGNOSIS — E785 Hyperlipidemia, unspecified: Secondary | ICD-10-CM | POA: Diagnosis not present

## 2021-03-10 DIAGNOSIS — J3089 Other allergic rhinitis: Secondary | ICD-10-CM | POA: Diagnosis not present

## 2021-03-10 DIAGNOSIS — J301 Allergic rhinitis due to pollen: Secondary | ICD-10-CM | POA: Diagnosis not present

## 2021-03-14 DIAGNOSIS — J3089 Other allergic rhinitis: Secondary | ICD-10-CM | POA: Diagnosis not present

## 2021-03-14 DIAGNOSIS — J301 Allergic rhinitis due to pollen: Secondary | ICD-10-CM | POA: Diagnosis not present

## 2021-03-28 DIAGNOSIS — M1009 Idiopathic gout, multiple sites: Secondary | ICD-10-CM | POA: Diagnosis not present

## 2021-03-28 DIAGNOSIS — J3089 Other allergic rhinitis: Secondary | ICD-10-CM | POA: Diagnosis not present

## 2021-03-28 DIAGNOSIS — J301 Allergic rhinitis due to pollen: Secondary | ICD-10-CM | POA: Diagnosis not present

## 2021-04-07 DIAGNOSIS — H109 Unspecified conjunctivitis: Secondary | ICD-10-CM | POA: Diagnosis not present

## 2021-04-07 DIAGNOSIS — J019 Acute sinusitis, unspecified: Secondary | ICD-10-CM | POA: Diagnosis not present

## 2021-04-26 DIAGNOSIS — J301 Allergic rhinitis due to pollen: Secondary | ICD-10-CM | POA: Diagnosis not present

## 2021-04-26 DIAGNOSIS — J3089 Other allergic rhinitis: Secondary | ICD-10-CM | POA: Diagnosis not present

## 2021-05-02 DIAGNOSIS — J3089 Other allergic rhinitis: Secondary | ICD-10-CM | POA: Diagnosis not present

## 2021-05-02 DIAGNOSIS — J301 Allergic rhinitis due to pollen: Secondary | ICD-10-CM | POA: Diagnosis not present

## 2021-05-04 DIAGNOSIS — L989 Disorder of the skin and subcutaneous tissue, unspecified: Secondary | ICD-10-CM | POA: Diagnosis not present

## 2021-05-04 DIAGNOSIS — L218 Other seborrheic dermatitis: Secondary | ICD-10-CM | POA: Diagnosis not present

## 2021-05-04 DIAGNOSIS — L821 Other seborrheic keratosis: Secondary | ICD-10-CM | POA: Diagnosis not present

## 2021-05-04 DIAGNOSIS — D225 Melanocytic nevi of trunk: Secondary | ICD-10-CM | POA: Diagnosis not present

## 2021-05-04 DIAGNOSIS — L814 Other melanin hyperpigmentation: Secondary | ICD-10-CM | POA: Diagnosis not present

## 2021-05-10 DIAGNOSIS — J3089 Other allergic rhinitis: Secondary | ICD-10-CM | POA: Diagnosis not present

## 2021-05-10 DIAGNOSIS — J301 Allergic rhinitis due to pollen: Secondary | ICD-10-CM | POA: Diagnosis not present

## 2021-05-10 DIAGNOSIS — M1009 Idiopathic gout, multiple sites: Secondary | ICD-10-CM | POA: Diagnosis not present

## 2021-05-10 DIAGNOSIS — R1031 Right lower quadrant pain: Secondary | ICD-10-CM | POA: Diagnosis not present

## 2021-05-10 DIAGNOSIS — R102 Pelvic and perineal pain: Secondary | ICD-10-CM | POA: Diagnosis not present

## 2021-05-11 ENCOUNTER — Other Ambulatory Visit (HOSPITAL_BASED_OUTPATIENT_CLINIC_OR_DEPARTMENT_OTHER): Payer: Self-pay | Admitting: Family Medicine

## 2021-05-11 ENCOUNTER — Ambulatory Visit (HOSPITAL_BASED_OUTPATIENT_CLINIC_OR_DEPARTMENT_OTHER)
Admission: RE | Admit: 2021-05-11 | Discharge: 2021-05-11 | Disposition: A | Discharge status: Home / Self Care | Payer: BC Managed Care – PPO | Source: Ambulatory Visit | Attending: Family Medicine | Admitting: Family Medicine

## 2021-05-11 ENCOUNTER — Ambulatory Visit (HOSPITAL_BASED_OUTPATIENT_CLINIC_OR_DEPARTMENT_OTHER): Payer: BC Managed Care – PPO

## 2021-05-11 ENCOUNTER — Other Ambulatory Visit: Payer: Self-pay

## 2021-05-11 DIAGNOSIS — R102 Pelvic and perineal pain: Secondary | ICD-10-CM | POA: Diagnosis not present

## 2021-05-11 DIAGNOSIS — N433 Hydrocele, unspecified: Secondary | ICD-10-CM | POA: Diagnosis not present

## 2021-05-11 DIAGNOSIS — R109 Unspecified abdominal pain: Secondary | ICD-10-CM | POA: Diagnosis not present

## 2021-05-18 DIAGNOSIS — J301 Allergic rhinitis due to pollen: Secondary | ICD-10-CM | POA: Diagnosis not present

## 2021-05-18 DIAGNOSIS — J3089 Other allergic rhinitis: Secondary | ICD-10-CM | POA: Diagnosis not present

## 2021-05-25 DIAGNOSIS — I861 Scrotal varices: Secondary | ICD-10-CM | POA: Diagnosis not present

## 2021-05-25 DIAGNOSIS — K4090 Unilateral inguinal hernia, without obstruction or gangrene, not specified as recurrent: Secondary | ICD-10-CM | POA: Diagnosis not present

## 2021-05-30 DIAGNOSIS — L905 Scar conditions and fibrosis of skin: Secondary | ICD-10-CM | POA: Diagnosis not present

## 2021-05-30 DIAGNOSIS — D0359 Melanoma in situ of other part of trunk: Secondary | ICD-10-CM | POA: Diagnosis not present

## 2021-06-01 DIAGNOSIS — J3089 Other allergic rhinitis: Secondary | ICD-10-CM | POA: Diagnosis not present

## 2021-06-01 DIAGNOSIS — J301 Allergic rhinitis due to pollen: Secondary | ICD-10-CM | POA: Diagnosis not present

## 2021-06-07 DIAGNOSIS — R1031 Right lower quadrant pain: Secondary | ICD-10-CM | POA: Diagnosis not present

## 2021-06-14 DIAGNOSIS — D125 Benign neoplasm of sigmoid colon: Secondary | ICD-10-CM | POA: Diagnosis not present

## 2021-06-14 DIAGNOSIS — Z86010 Personal history of colonic polyps: Secondary | ICD-10-CM | POA: Diagnosis not present

## 2021-06-14 DIAGNOSIS — D123 Benign neoplasm of transverse colon: Secondary | ICD-10-CM | POA: Diagnosis not present

## 2021-06-14 DIAGNOSIS — D122 Benign neoplasm of ascending colon: Secondary | ICD-10-CM | POA: Diagnosis not present

## 2021-06-15 DIAGNOSIS — J3089 Other allergic rhinitis: Secondary | ICD-10-CM | POA: Diagnosis not present

## 2021-06-15 DIAGNOSIS — J301 Allergic rhinitis due to pollen: Secondary | ICD-10-CM | POA: Diagnosis not present

## 2021-06-17 DIAGNOSIS — N50811 Right testicular pain: Secondary | ICD-10-CM | POA: Diagnosis not present

## 2021-06-17 DIAGNOSIS — I861 Scrotal varices: Secondary | ICD-10-CM | POA: Diagnosis not present

## 2021-06-23 DIAGNOSIS — J301 Allergic rhinitis due to pollen: Secondary | ICD-10-CM | POA: Diagnosis not present

## 2021-06-23 DIAGNOSIS — J3089 Other allergic rhinitis: Secondary | ICD-10-CM | POA: Diagnosis not present

## 2021-06-30 DIAGNOSIS — J301 Allergic rhinitis due to pollen: Secondary | ICD-10-CM | POA: Diagnosis not present

## 2021-06-30 DIAGNOSIS — J3089 Other allergic rhinitis: Secondary | ICD-10-CM | POA: Diagnosis not present

## 2021-07-05 DIAGNOSIS — J301 Allergic rhinitis due to pollen: Secondary | ICD-10-CM | POA: Diagnosis not present

## 2021-07-06 DIAGNOSIS — J3089 Other allergic rhinitis: Secondary | ICD-10-CM | POA: Diagnosis not present

## 2021-07-14 DIAGNOSIS — J3081 Allergic rhinitis due to animal (cat) (dog) hair and dander: Secondary | ICD-10-CM | POA: Diagnosis not present

## 2021-07-14 DIAGNOSIS — J3089 Other allergic rhinitis: Secondary | ICD-10-CM | POA: Diagnosis not present

## 2021-07-14 DIAGNOSIS — J301 Allergic rhinitis due to pollen: Secondary | ICD-10-CM | POA: Diagnosis not present

## 2021-07-22 DIAGNOSIS — J3089 Other allergic rhinitis: Secondary | ICD-10-CM | POA: Diagnosis not present

## 2021-07-22 DIAGNOSIS — J301 Allergic rhinitis due to pollen: Secondary | ICD-10-CM | POA: Diagnosis not present

## 2021-07-22 DIAGNOSIS — J3081 Allergic rhinitis due to animal (cat) (dog) hair and dander: Secondary | ICD-10-CM | POA: Diagnosis not present

## 2021-07-28 DIAGNOSIS — N50811 Right testicular pain: Secondary | ICD-10-CM | POA: Diagnosis not present

## 2021-07-28 DIAGNOSIS — I861 Scrotal varices: Secondary | ICD-10-CM | POA: Diagnosis not present

## 2021-07-28 DIAGNOSIS — J3089 Other allergic rhinitis: Secondary | ICD-10-CM | POA: Diagnosis not present

## 2021-07-28 DIAGNOSIS — J301 Allergic rhinitis due to pollen: Secondary | ICD-10-CM | POA: Diagnosis not present

## 2021-08-12 DIAGNOSIS — J301 Allergic rhinitis due to pollen: Secondary | ICD-10-CM | POA: Diagnosis not present

## 2021-08-12 DIAGNOSIS — J3089 Other allergic rhinitis: Secondary | ICD-10-CM | POA: Diagnosis not present

## 2021-08-17 DIAGNOSIS — J3089 Other allergic rhinitis: Secondary | ICD-10-CM | POA: Diagnosis not present

## 2021-08-17 DIAGNOSIS — J301 Allergic rhinitis due to pollen: Secondary | ICD-10-CM | POA: Diagnosis not present

## 2021-08-17 DIAGNOSIS — J3081 Allergic rhinitis due to animal (cat) (dog) hair and dander: Secondary | ICD-10-CM | POA: Diagnosis not present

## 2021-08-24 DIAGNOSIS — J3089 Other allergic rhinitis: Secondary | ICD-10-CM | POA: Diagnosis not present

## 2021-08-24 DIAGNOSIS — J301 Allergic rhinitis due to pollen: Secondary | ICD-10-CM | POA: Diagnosis not present

## 2021-08-31 DIAGNOSIS — J301 Allergic rhinitis due to pollen: Secondary | ICD-10-CM | POA: Diagnosis not present

## 2021-08-31 DIAGNOSIS — J3081 Allergic rhinitis due to animal (cat) (dog) hair and dander: Secondary | ICD-10-CM | POA: Diagnosis not present

## 2021-08-31 DIAGNOSIS — J3089 Other allergic rhinitis: Secondary | ICD-10-CM | POA: Diagnosis not present

## 2021-09-07 DIAGNOSIS — J301 Allergic rhinitis due to pollen: Secondary | ICD-10-CM | POA: Diagnosis not present

## 2021-09-07 DIAGNOSIS — J3089 Other allergic rhinitis: Secondary | ICD-10-CM | POA: Diagnosis not present

## 2021-09-16 DIAGNOSIS — J3089 Other allergic rhinitis: Secondary | ICD-10-CM | POA: Diagnosis not present

## 2021-09-16 DIAGNOSIS — J301 Allergic rhinitis due to pollen: Secondary | ICD-10-CM | POA: Diagnosis not present

## 2021-09-23 DIAGNOSIS — D225 Melanocytic nevi of trunk: Secondary | ICD-10-CM | POA: Diagnosis not present

## 2021-09-23 DIAGNOSIS — L218 Other seborrheic dermatitis: Secondary | ICD-10-CM | POA: Diagnosis not present

## 2021-09-23 DIAGNOSIS — L814 Other melanin hyperpigmentation: Secondary | ICD-10-CM | POA: Diagnosis not present

## 2021-09-23 DIAGNOSIS — L821 Other seborrheic keratosis: Secondary | ICD-10-CM | POA: Diagnosis not present

## 2021-10-04 DIAGNOSIS — H40013 Open angle with borderline findings, low risk, bilateral: Secondary | ICD-10-CM | POA: Diagnosis not present

## 2021-10-06 DIAGNOSIS — J3089 Other allergic rhinitis: Secondary | ICD-10-CM | POA: Diagnosis not present

## 2021-10-06 DIAGNOSIS — J3081 Allergic rhinitis due to animal (cat) (dog) hair and dander: Secondary | ICD-10-CM | POA: Diagnosis not present

## 2021-10-06 DIAGNOSIS — J301 Allergic rhinitis due to pollen: Secondary | ICD-10-CM | POA: Diagnosis not present

## 2021-10-13 DIAGNOSIS — J301 Allergic rhinitis due to pollen: Secondary | ICD-10-CM | POA: Diagnosis not present

## 2021-10-13 DIAGNOSIS — J3081 Allergic rhinitis due to animal (cat) (dog) hair and dander: Secondary | ICD-10-CM | POA: Diagnosis not present

## 2021-10-13 DIAGNOSIS — J3089 Other allergic rhinitis: Secondary | ICD-10-CM | POA: Diagnosis not present

## 2021-10-18 DIAGNOSIS — J3089 Other allergic rhinitis: Secondary | ICD-10-CM | POA: Diagnosis not present

## 2021-10-18 DIAGNOSIS — J3081 Allergic rhinitis due to animal (cat) (dog) hair and dander: Secondary | ICD-10-CM | POA: Diagnosis not present

## 2021-10-18 DIAGNOSIS — J301 Allergic rhinitis due to pollen: Secondary | ICD-10-CM | POA: Diagnosis not present

## 2021-10-21 DIAGNOSIS — J3089 Other allergic rhinitis: Secondary | ICD-10-CM | POA: Diagnosis not present

## 2021-10-21 DIAGNOSIS — J301 Allergic rhinitis due to pollen: Secondary | ICD-10-CM | POA: Diagnosis not present

## 2021-10-21 DIAGNOSIS — J3081 Allergic rhinitis due to animal (cat) (dog) hair and dander: Secondary | ICD-10-CM | POA: Diagnosis not present

## 2021-10-28 DIAGNOSIS — J3081 Allergic rhinitis due to animal (cat) (dog) hair and dander: Secondary | ICD-10-CM | POA: Diagnosis not present

## 2021-10-28 DIAGNOSIS — J3089 Other allergic rhinitis: Secondary | ICD-10-CM | POA: Diagnosis not present

## 2021-10-28 DIAGNOSIS — J301 Allergic rhinitis due to pollen: Secondary | ICD-10-CM | POA: Diagnosis not present

## 2021-10-30 DIAGNOSIS — H1032 Unspecified acute conjunctivitis, left eye: Secondary | ICD-10-CM | POA: Diagnosis not present

## 2021-11-04 DIAGNOSIS — J3081 Allergic rhinitis due to animal (cat) (dog) hair and dander: Secondary | ICD-10-CM | POA: Diagnosis not present

## 2021-11-04 DIAGNOSIS — J3089 Other allergic rhinitis: Secondary | ICD-10-CM | POA: Diagnosis not present

## 2021-11-04 DIAGNOSIS — J301 Allergic rhinitis due to pollen: Secondary | ICD-10-CM | POA: Diagnosis not present

## 2021-11-09 DIAGNOSIS — J3089 Other allergic rhinitis: Secondary | ICD-10-CM | POA: Diagnosis not present

## 2021-11-09 DIAGNOSIS — J3081 Allergic rhinitis due to animal (cat) (dog) hair and dander: Secondary | ICD-10-CM | POA: Diagnosis not present

## 2021-11-09 DIAGNOSIS — R040 Epistaxis: Secondary | ICD-10-CM | POA: Diagnosis not present

## 2021-11-09 DIAGNOSIS — J301 Allergic rhinitis due to pollen: Secondary | ICD-10-CM | POA: Diagnosis not present

## 2021-11-23 DIAGNOSIS — J3081 Allergic rhinitis due to animal (cat) (dog) hair and dander: Secondary | ICD-10-CM | POA: Diagnosis not present

## 2021-11-23 DIAGNOSIS — J301 Allergic rhinitis due to pollen: Secondary | ICD-10-CM | POA: Diagnosis not present

## 2021-11-23 DIAGNOSIS — J3089 Other allergic rhinitis: Secondary | ICD-10-CM | POA: Diagnosis not present

## 2021-12-12 DIAGNOSIS — J3089 Other allergic rhinitis: Secondary | ICD-10-CM | POA: Diagnosis not present

## 2021-12-12 DIAGNOSIS — J301 Allergic rhinitis due to pollen: Secondary | ICD-10-CM | POA: Diagnosis not present

## 2021-12-12 DIAGNOSIS — J3081 Allergic rhinitis due to animal (cat) (dog) hair and dander: Secondary | ICD-10-CM | POA: Diagnosis not present

## 2022-01-05 DIAGNOSIS — J301 Allergic rhinitis due to pollen: Secondary | ICD-10-CM | POA: Diagnosis not present

## 2022-01-05 DIAGNOSIS — J3089 Other allergic rhinitis: Secondary | ICD-10-CM | POA: Diagnosis not present

## 2022-01-19 DIAGNOSIS — J3081 Allergic rhinitis due to animal (cat) (dog) hair and dander: Secondary | ICD-10-CM | POA: Diagnosis not present

## 2022-01-19 DIAGNOSIS — J301 Allergic rhinitis due to pollen: Secondary | ICD-10-CM | POA: Diagnosis not present

## 2022-01-19 DIAGNOSIS — J3089 Other allergic rhinitis: Secondary | ICD-10-CM | POA: Diagnosis not present

## 2022-02-01 DIAGNOSIS — J3089 Other allergic rhinitis: Secondary | ICD-10-CM | POA: Diagnosis not present

## 2022-02-01 DIAGNOSIS — J3081 Allergic rhinitis due to animal (cat) (dog) hair and dander: Secondary | ICD-10-CM | POA: Diagnosis not present

## 2022-02-01 DIAGNOSIS — J301 Allergic rhinitis due to pollen: Secondary | ICD-10-CM | POA: Diagnosis not present

## 2022-02-14 IMAGING — US US SCROTUM W/ DOPPLER COMPLETE
1 series · 14 of 25 positions shown · non-contrast
Comparison: None.

CLINICAL DATA: Suprapubic pain, right inguinal pain

EXAM:
SCROTAL ULTRASOUND
DOPPLER ULTRASOUND OF THE TESTICLES
TECHNIQUE: Complete ultrasound examination of the testicles, epididymis, and
other scrotal structures was performed. Color and spectral Doppler
ultrasound were also utilized to evaluate blood flow to the
testicles.

[Series 1: us scrotum w/doppler · 56 acquisitions, 14 frames shown]
[im 1/56]
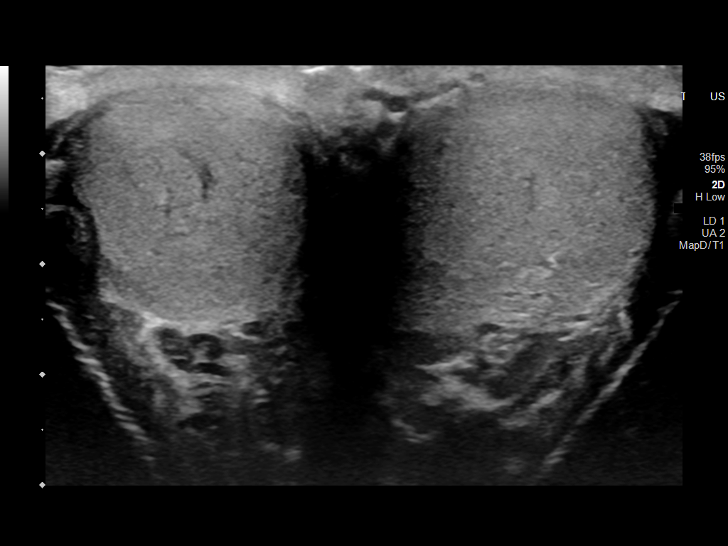
[im 5/56]
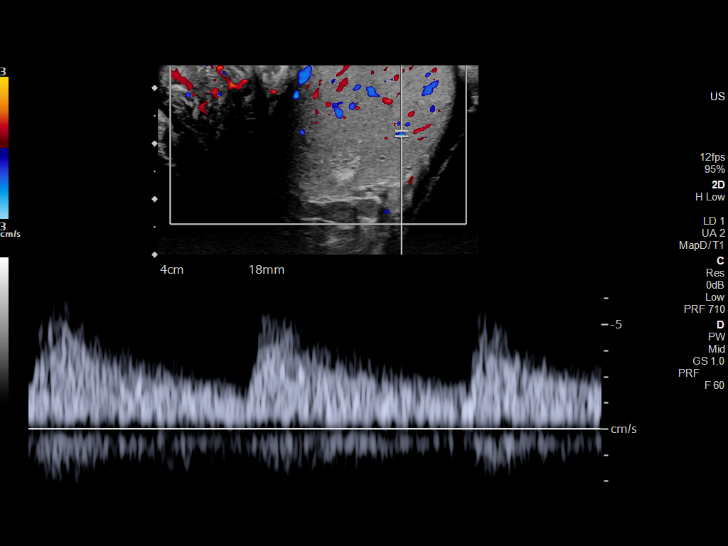
[im 10/56]
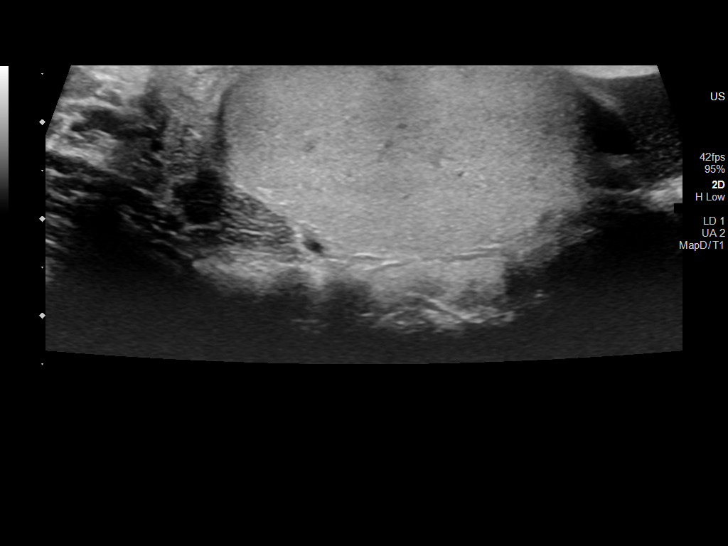
[im 14/56]
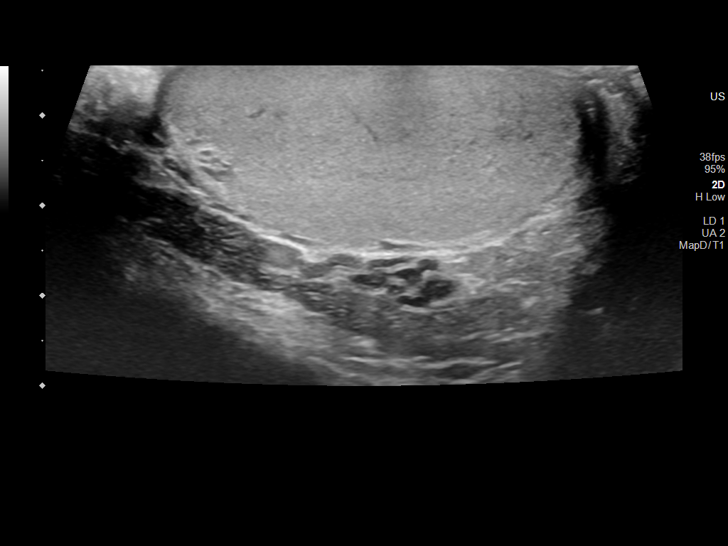
[im 19/56]
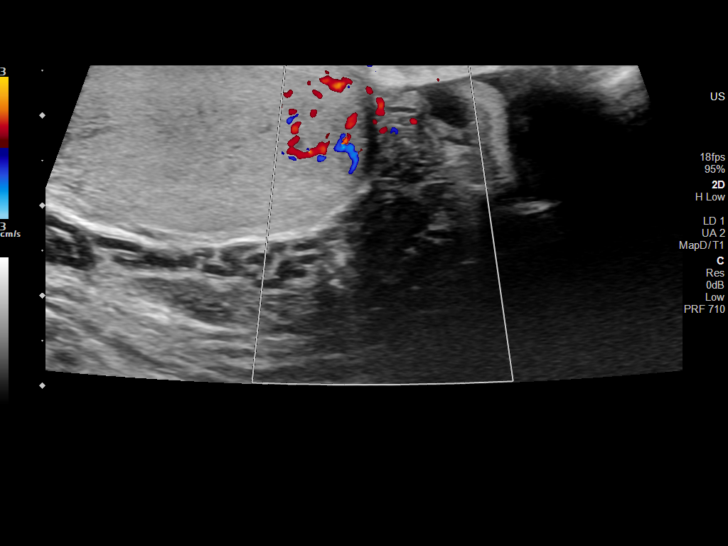
[im 21/56]
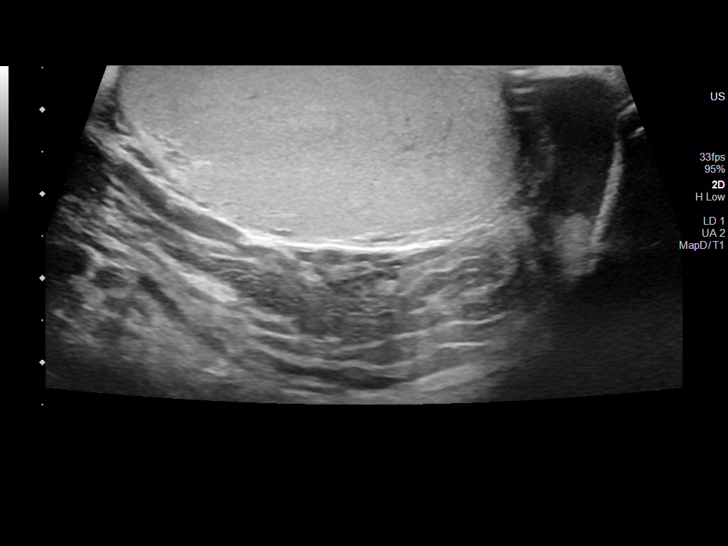
[im 26/56]
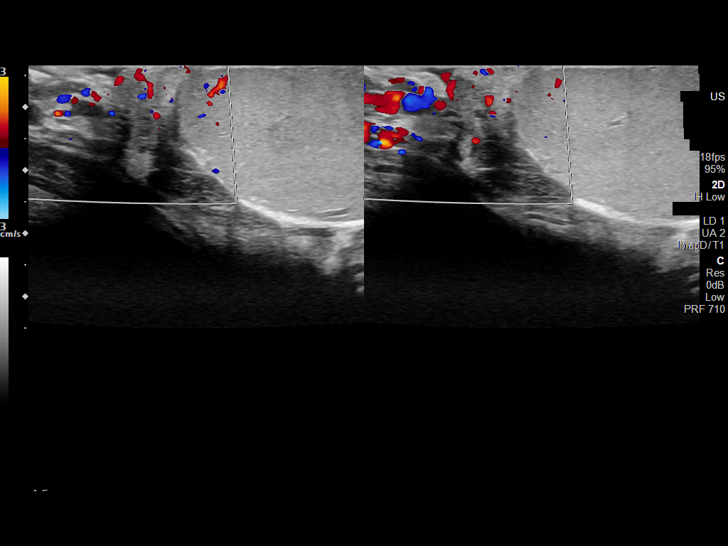
[im 30/56]
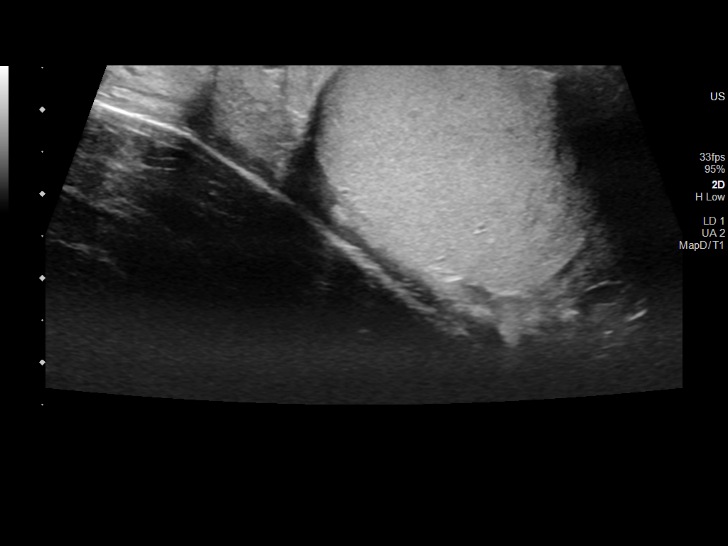
[im 35/56]
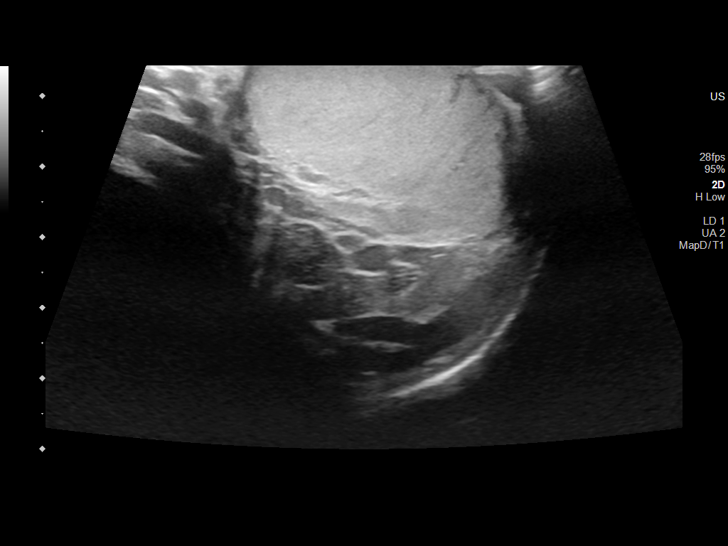
[im 37/56]
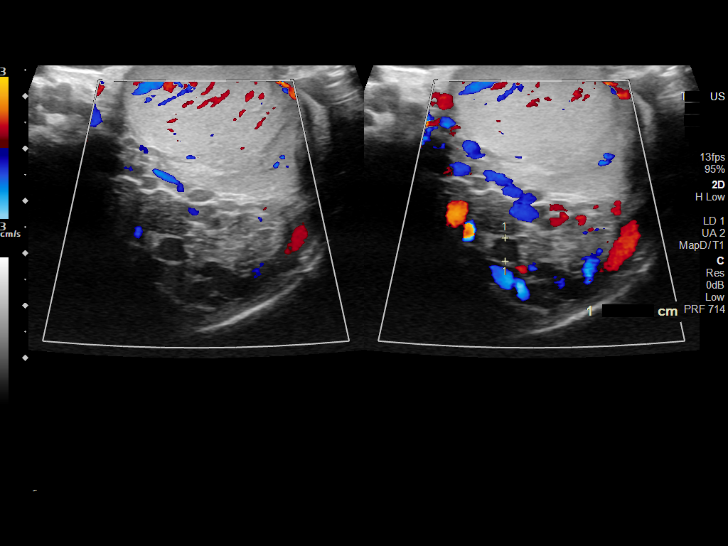
[im 42/56]
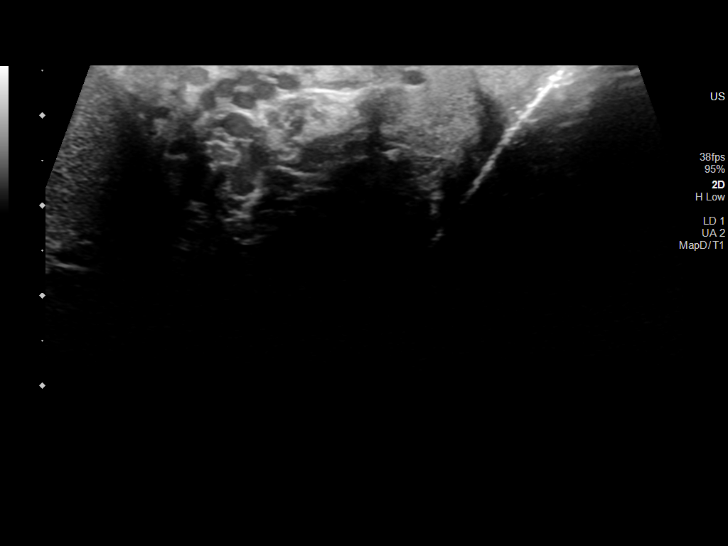
[im 46/56]
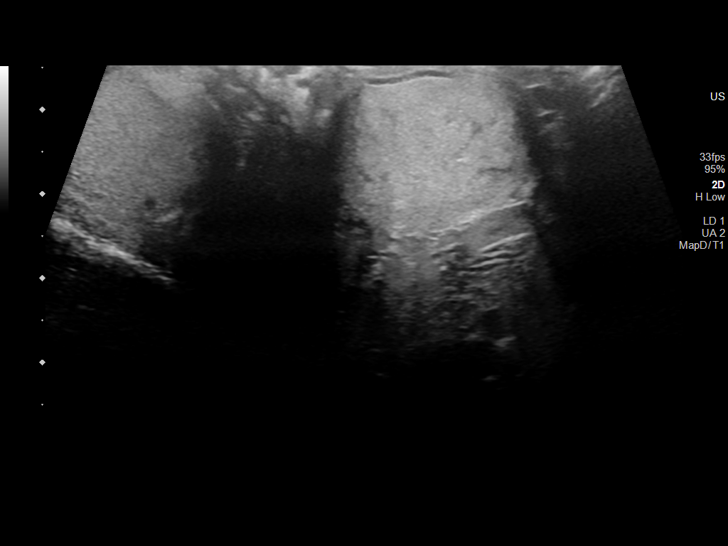
[im 51/56]
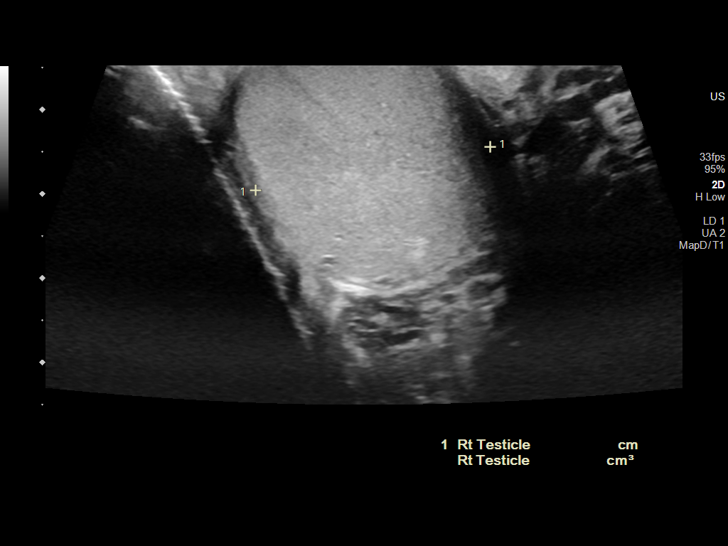
[im 56/56]
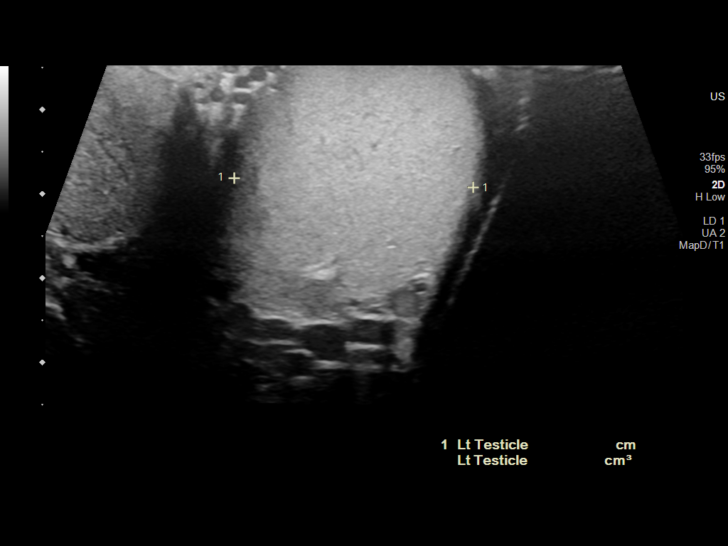

[14 of 25 positions shown; findings below may reference images not displayed]

FINDINGS: Right testicle

Measurements: 4.7 x 2.2 x 2.8 cm. No mass or microlithiasis
visualized.

Left testicle

Measurements: 4.3 x 2.6 x 2.8 cm. No mass or microlithiasis
visualized.

Right epididymis:  Normal in size and appearance.

Left epididymis:  Normal in size and appearance.

Hydrocele:  Small right hydrocele.

Varicocele: There is ectasia of veins in pampiniform plexus on both
sides, more so on the left side suggesting varicocele.

Pulsed Doppler interrogation of both testes demonstrates normal low
resistance arterial and venous waveforms bilaterally.
IMPRESSION: There is homogeneous echogenicity in both testes. There is no
evidence of testicular torsion. Small right hydrocele. Bilateral
varicocele.

## 2022-02-14 IMAGING — US US PELVIS LIMITED
1 series · 14 of 23 positions shown · non-contrast
Comparison: None.

CLINICAL DATA: Suprapubic pain, right groin pain. Right testicular
pain.

EXAM:
ULTRASOUND OF Right GROIN SOFT TISSUES
TECHNIQUE: Ultrasound examination of the groin soft tissues was performed in
the area of clinical concern.

[Series 1: us pelvis limited (transabdominal only) · 23 acquisitions, 14 frames shown]
[im 1/23]
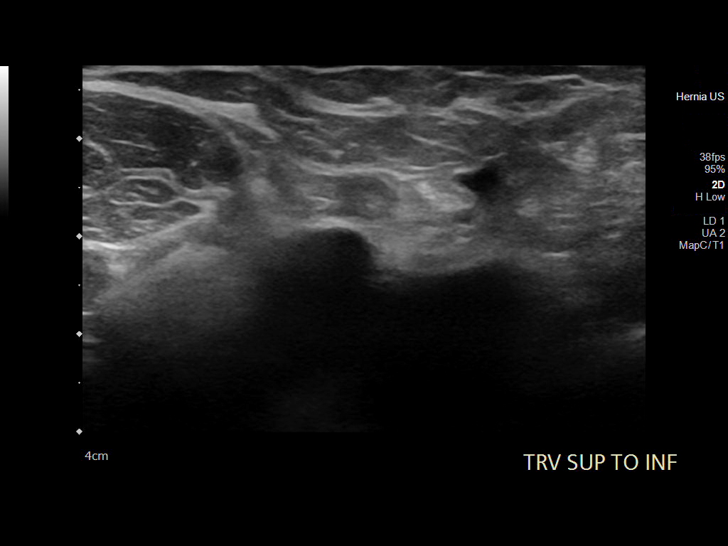
[im 3/23]
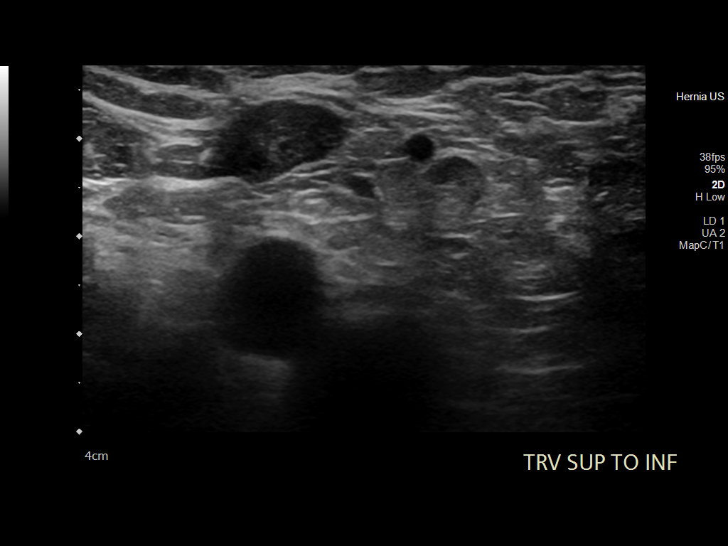
[im 5/23]
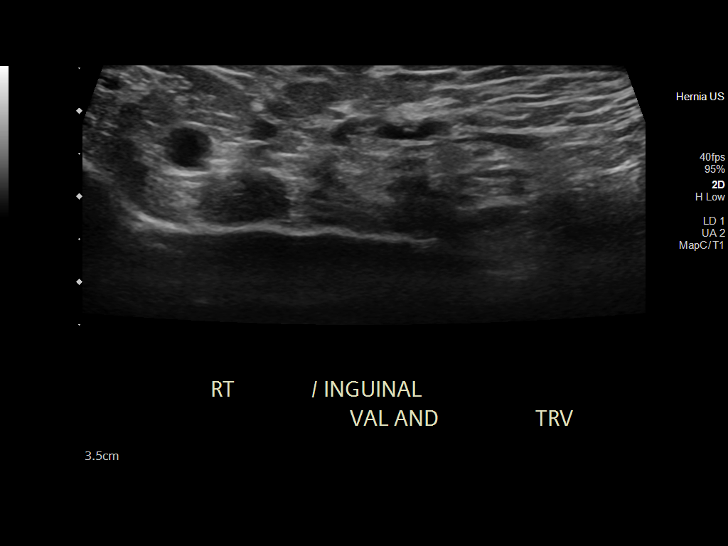
[im 6/23]
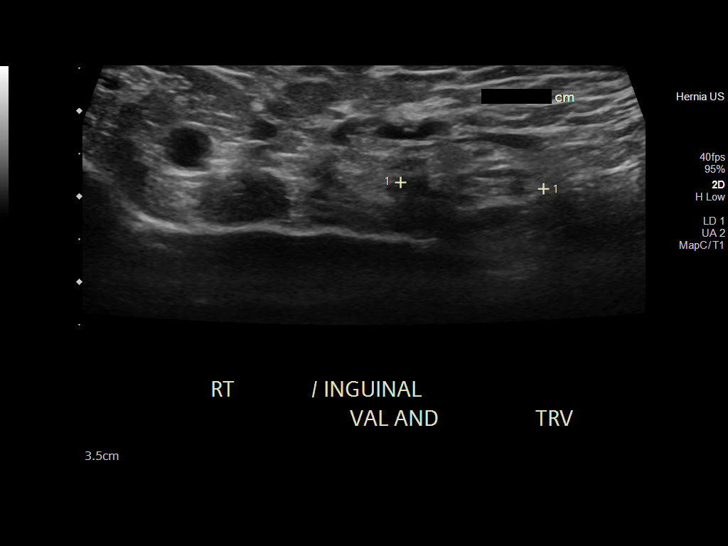
[im 8/23]
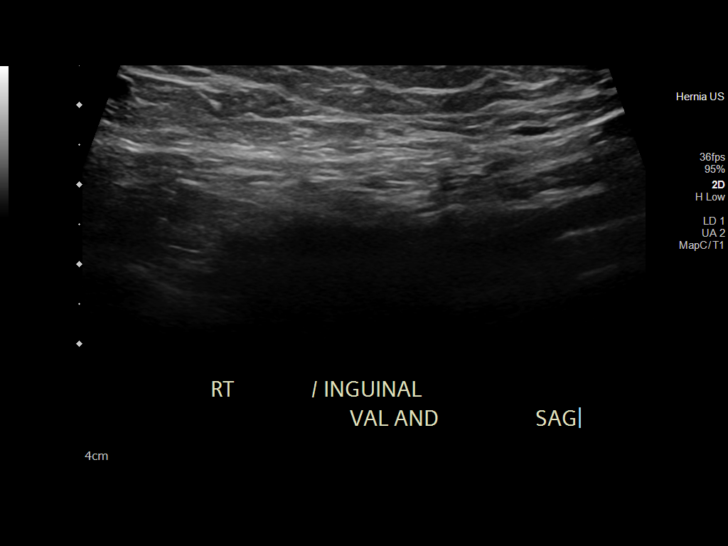
[im 10/23]
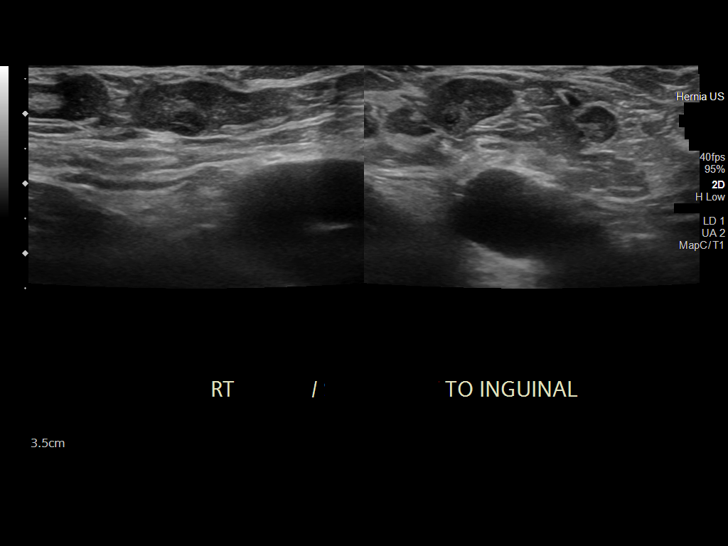
[im 11/23]
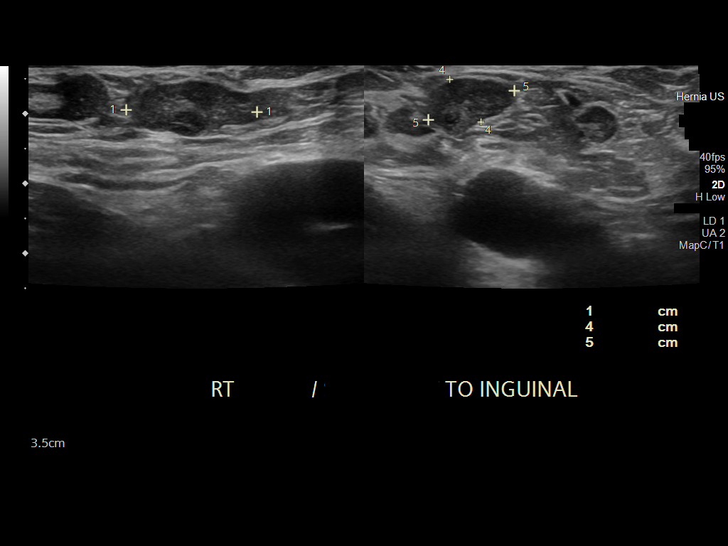
[im 13/23]
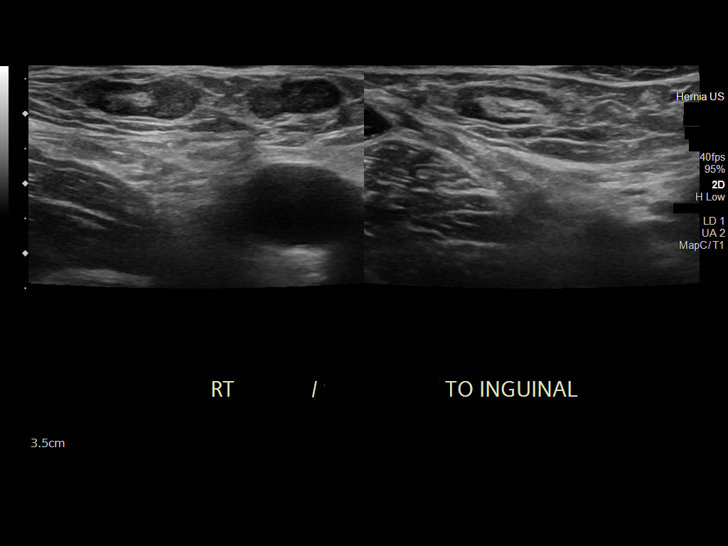
[im 14/23]
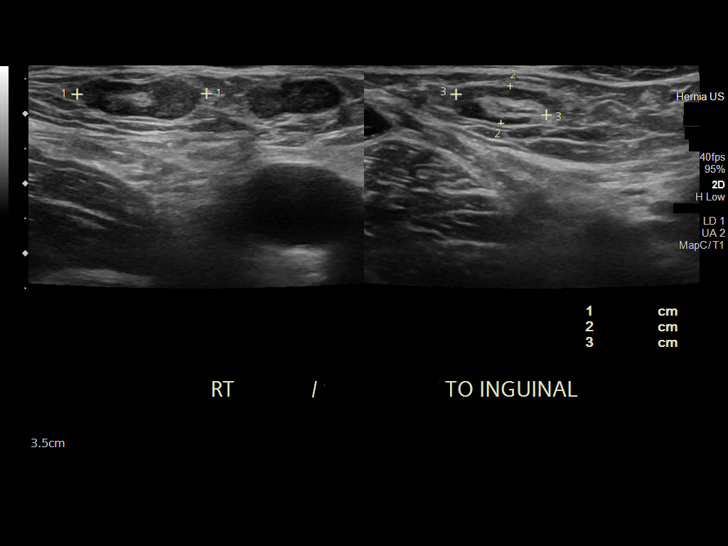
[im 16/23]
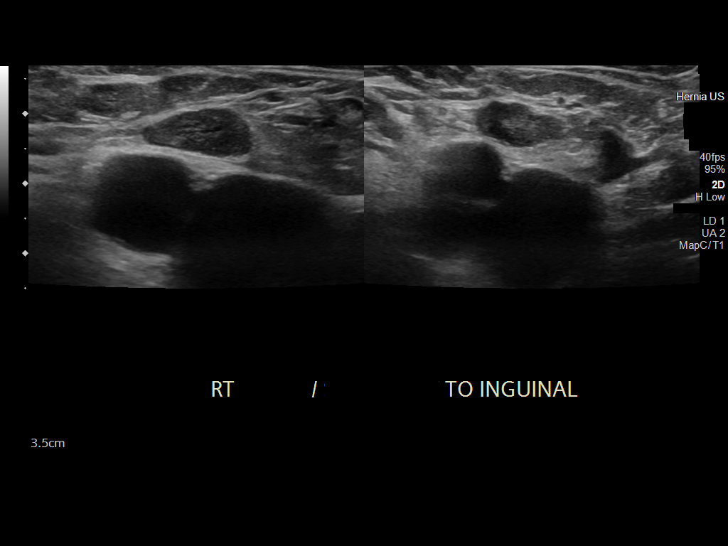
[im 18/23]
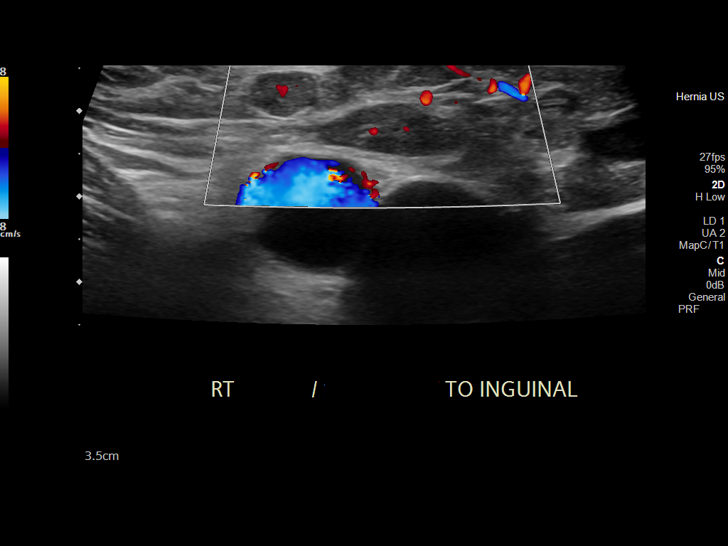
[im 19/23]
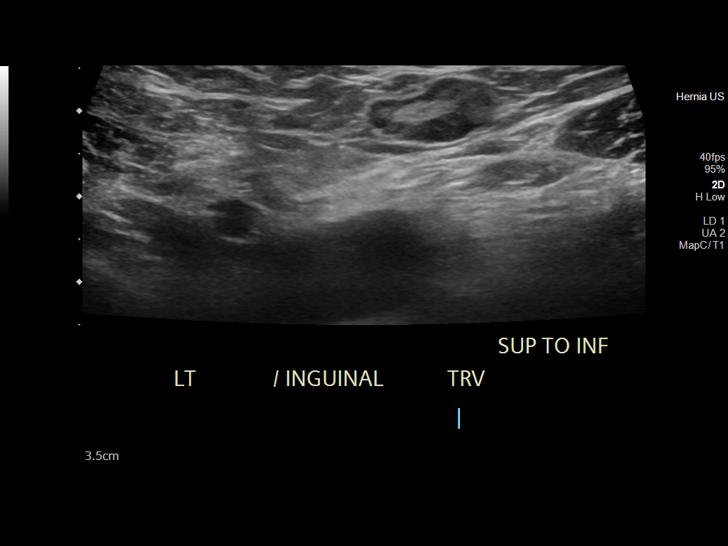
[im 21/23]
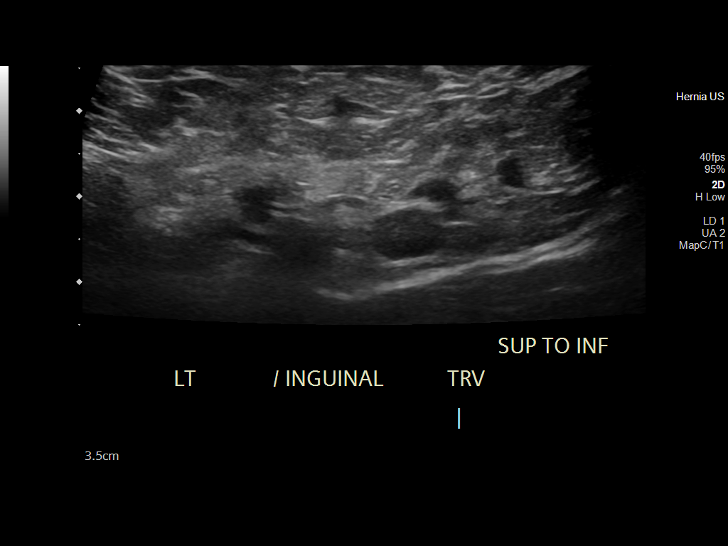
[im 23/23]
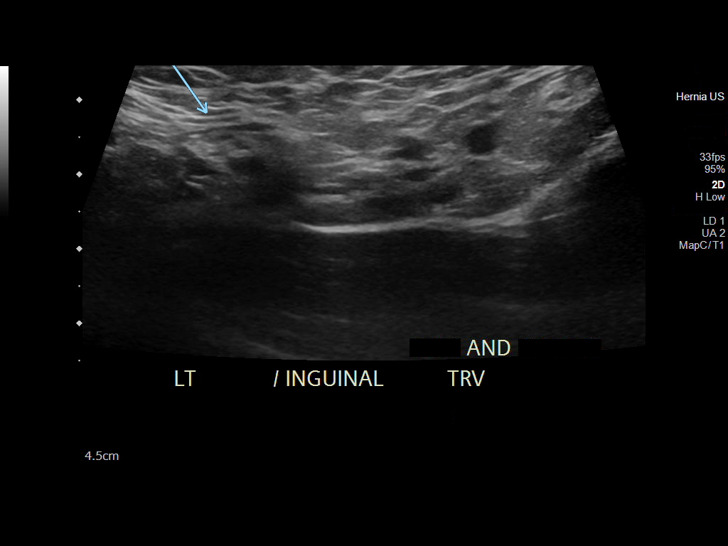

[14 of 23 positions shown; findings below may reference images not displayed]

FINDINGS: There are multiple enlarged inguinal lymph nodes, with fatty hilum.
There is a small area of movement with Valsalva maneuver likely
representing fat containing inguinal hernia.
IMPRESSION: 1. Multiple benign-appearing enlarged right inguinal lymph nodes.
2. Fat containing small right inguinal hernia.

## 2022-02-28 DIAGNOSIS — J3089 Other allergic rhinitis: Secondary | ICD-10-CM | POA: Diagnosis not present

## 2022-02-28 DIAGNOSIS — J301 Allergic rhinitis due to pollen: Secondary | ICD-10-CM | POA: Diagnosis not present

## 2022-03-02 DIAGNOSIS — J301 Allergic rhinitis due to pollen: Secondary | ICD-10-CM | POA: Diagnosis not present

## 2022-03-03 DIAGNOSIS — J3089 Other allergic rhinitis: Secondary | ICD-10-CM | POA: Diagnosis not present

## 2022-03-07 DIAGNOSIS — Z0000 Encounter for general adult medical examination without abnormal findings: Secondary | ICD-10-CM | POA: Diagnosis not present

## 2022-03-08 DIAGNOSIS — Z125 Encounter for screening for malignant neoplasm of prostate: Secondary | ICD-10-CM | POA: Diagnosis not present

## 2022-03-08 DIAGNOSIS — E785 Hyperlipidemia, unspecified: Secondary | ICD-10-CM | POA: Diagnosis not present

## 2022-03-08 DIAGNOSIS — M1009 Idiopathic gout, multiple sites: Secondary | ICD-10-CM | POA: Diagnosis not present

## 2022-03-15 DIAGNOSIS — J3089 Other allergic rhinitis: Secondary | ICD-10-CM | POA: Diagnosis not present

## 2022-03-15 DIAGNOSIS — J301 Allergic rhinitis due to pollen: Secondary | ICD-10-CM | POA: Diagnosis not present

## 2022-04-03 DIAGNOSIS — Z08 Encounter for follow-up examination after completed treatment for malignant neoplasm: Secondary | ICD-10-CM | POA: Diagnosis not present

## 2022-04-03 DIAGNOSIS — L821 Other seborrheic keratosis: Secondary | ICD-10-CM | POA: Diagnosis not present

## 2022-04-03 DIAGNOSIS — L814 Other melanin hyperpigmentation: Secondary | ICD-10-CM | POA: Diagnosis not present

## 2022-04-03 DIAGNOSIS — D225 Melanocytic nevi of trunk: Secondary | ICD-10-CM | POA: Diagnosis not present

## 2022-04-12 DIAGNOSIS — J3089 Other allergic rhinitis: Secondary | ICD-10-CM | POA: Diagnosis not present

## 2022-04-12 DIAGNOSIS — J301 Allergic rhinitis due to pollen: Secondary | ICD-10-CM | POA: Diagnosis not present

## 2022-04-12 DIAGNOSIS — J3081 Allergic rhinitis due to animal (cat) (dog) hair and dander: Secondary | ICD-10-CM | POA: Diagnosis not present

## 2022-05-02 DIAGNOSIS — J301 Allergic rhinitis due to pollen: Secondary | ICD-10-CM | POA: Diagnosis not present

## 2022-05-02 DIAGNOSIS — J3081 Allergic rhinitis due to animal (cat) (dog) hair and dander: Secondary | ICD-10-CM | POA: Diagnosis not present

## 2022-05-02 DIAGNOSIS — J3089 Other allergic rhinitis: Secondary | ICD-10-CM | POA: Diagnosis not present

## 2022-05-29 DIAGNOSIS — J3089 Other allergic rhinitis: Secondary | ICD-10-CM | POA: Diagnosis not present

## 2022-05-29 DIAGNOSIS — J3081 Allergic rhinitis due to animal (cat) (dog) hair and dander: Secondary | ICD-10-CM | POA: Diagnosis not present

## 2022-05-29 DIAGNOSIS — J301 Allergic rhinitis due to pollen: Secondary | ICD-10-CM | POA: Diagnosis not present

## 2022-06-05 DIAGNOSIS — J3089 Other allergic rhinitis: Secondary | ICD-10-CM | POA: Diagnosis not present

## 2022-06-05 DIAGNOSIS — J301 Allergic rhinitis due to pollen: Secondary | ICD-10-CM | POA: Diagnosis not present

## 2022-06-05 DIAGNOSIS — J3081 Allergic rhinitis due to animal (cat) (dog) hair and dander: Secondary | ICD-10-CM | POA: Diagnosis not present

## 2022-06-28 DIAGNOSIS — J301 Allergic rhinitis due to pollen: Secondary | ICD-10-CM | POA: Diagnosis not present

## 2022-06-28 DIAGNOSIS — J3089 Other allergic rhinitis: Secondary | ICD-10-CM | POA: Diagnosis not present

## 2022-06-28 DIAGNOSIS — M1711 Unilateral primary osteoarthritis, right knee: Secondary | ICD-10-CM | POA: Diagnosis not present

## 2022-07-10 DIAGNOSIS — J301 Allergic rhinitis due to pollen: Secondary | ICD-10-CM | POA: Diagnosis not present

## 2022-07-10 DIAGNOSIS — J3089 Other allergic rhinitis: Secondary | ICD-10-CM | POA: Diagnosis not present

## 2022-07-10 DIAGNOSIS — J3081 Allergic rhinitis due to animal (cat) (dog) hair and dander: Secondary | ICD-10-CM | POA: Diagnosis not present

## 2022-07-17 DIAGNOSIS — J301 Allergic rhinitis due to pollen: Secondary | ICD-10-CM | POA: Diagnosis not present

## 2022-07-17 DIAGNOSIS — J3089 Other allergic rhinitis: Secondary | ICD-10-CM | POA: Diagnosis not present

## 2022-07-24 DIAGNOSIS — M1711 Unilateral primary osteoarthritis, right knee: Secondary | ICD-10-CM | POA: Diagnosis not present

## 2022-07-26 DIAGNOSIS — J3081 Allergic rhinitis due to animal (cat) (dog) hair and dander: Secondary | ICD-10-CM | POA: Diagnosis not present

## 2022-07-26 DIAGNOSIS — J301 Allergic rhinitis due to pollen: Secondary | ICD-10-CM | POA: Diagnosis not present

## 2022-07-26 DIAGNOSIS — J3089 Other allergic rhinitis: Secondary | ICD-10-CM | POA: Diagnosis not present

## 2022-07-31 DIAGNOSIS — J301 Allergic rhinitis due to pollen: Secondary | ICD-10-CM | POA: Diagnosis not present

## 2022-07-31 DIAGNOSIS — J3089 Other allergic rhinitis: Secondary | ICD-10-CM | POA: Diagnosis not present

## 2022-07-31 DIAGNOSIS — J3081 Allergic rhinitis due to animal (cat) (dog) hair and dander: Secondary | ICD-10-CM | POA: Diagnosis not present

## 2022-08-06 DIAGNOSIS — M25561 Pain in right knee: Secondary | ICD-10-CM | POA: Diagnosis not present

## 2022-08-07 DIAGNOSIS — J3081 Allergic rhinitis due to animal (cat) (dog) hair and dander: Secondary | ICD-10-CM | POA: Diagnosis not present

## 2022-08-07 DIAGNOSIS — J301 Allergic rhinitis due to pollen: Secondary | ICD-10-CM | POA: Diagnosis not present

## 2022-08-07 DIAGNOSIS — J3089 Other allergic rhinitis: Secondary | ICD-10-CM | POA: Diagnosis not present

## 2022-08-14 DIAGNOSIS — J301 Allergic rhinitis due to pollen: Secondary | ICD-10-CM | POA: Diagnosis not present

## 2022-08-14 DIAGNOSIS — J3081 Allergic rhinitis due to animal (cat) (dog) hair and dander: Secondary | ICD-10-CM | POA: Diagnosis not present

## 2022-08-14 DIAGNOSIS — J3089 Other allergic rhinitis: Secondary | ICD-10-CM | POA: Diagnosis not present

## 2022-08-21 DIAGNOSIS — J3081 Allergic rhinitis due to animal (cat) (dog) hair and dander: Secondary | ICD-10-CM | POA: Diagnosis not present

## 2022-08-21 DIAGNOSIS — M2241 Chondromalacia patellae, right knee: Secondary | ICD-10-CM | POA: Diagnosis not present

## 2022-08-21 DIAGNOSIS — M1712 Unilateral primary osteoarthritis, left knee: Secondary | ICD-10-CM | POA: Diagnosis not present

## 2022-08-21 DIAGNOSIS — J301 Allergic rhinitis due to pollen: Secondary | ICD-10-CM | POA: Diagnosis not present

## 2022-08-21 DIAGNOSIS — M1711 Unilateral primary osteoarthritis, right knee: Secondary | ICD-10-CM | POA: Diagnosis not present

## 2022-08-21 DIAGNOSIS — J3089 Other allergic rhinitis: Secondary | ICD-10-CM | POA: Diagnosis not present

## 2022-08-28 DIAGNOSIS — J3089 Other allergic rhinitis: Secondary | ICD-10-CM | POA: Diagnosis not present

## 2022-08-28 DIAGNOSIS — J3081 Allergic rhinitis due to animal (cat) (dog) hair and dander: Secondary | ICD-10-CM | POA: Diagnosis not present

## 2022-08-28 DIAGNOSIS — J301 Allergic rhinitis due to pollen: Secondary | ICD-10-CM | POA: Diagnosis not present

## 2022-09-04 DIAGNOSIS — J301 Allergic rhinitis due to pollen: Secondary | ICD-10-CM | POA: Diagnosis not present

## 2022-09-04 DIAGNOSIS — J3081 Allergic rhinitis due to animal (cat) (dog) hair and dander: Secondary | ICD-10-CM | POA: Diagnosis not present

## 2022-09-04 DIAGNOSIS — J3089 Other allergic rhinitis: Secondary | ICD-10-CM | POA: Diagnosis not present

## 2022-09-11 DIAGNOSIS — J3081 Allergic rhinitis due to animal (cat) (dog) hair and dander: Secondary | ICD-10-CM | POA: Diagnosis not present

## 2022-09-11 DIAGNOSIS — J301 Allergic rhinitis due to pollen: Secondary | ICD-10-CM | POA: Diagnosis not present

## 2022-09-11 DIAGNOSIS — J3089 Other allergic rhinitis: Secondary | ICD-10-CM | POA: Diagnosis not present

## 2022-09-14 DIAGNOSIS — M25561 Pain in right knee: Secondary | ICD-10-CM | POA: Diagnosis not present

## 2022-09-19 DIAGNOSIS — J3089 Other allergic rhinitis: Secondary | ICD-10-CM | POA: Diagnosis not present

## 2022-09-19 DIAGNOSIS — M25561 Pain in right knee: Secondary | ICD-10-CM | POA: Diagnosis not present

## 2022-09-19 DIAGNOSIS — J3081 Allergic rhinitis due to animal (cat) (dog) hair and dander: Secondary | ICD-10-CM | POA: Diagnosis not present

## 2022-09-19 DIAGNOSIS — J301 Allergic rhinitis due to pollen: Secondary | ICD-10-CM | POA: Diagnosis not present

## 2022-09-22 DIAGNOSIS — M1711 Unilateral primary osteoarthritis, right knee: Secondary | ICD-10-CM | POA: Diagnosis not present

## 2022-09-25 DIAGNOSIS — J3081 Allergic rhinitis due to animal (cat) (dog) hair and dander: Secondary | ICD-10-CM | POA: Diagnosis not present

## 2022-09-25 DIAGNOSIS — J3089 Other allergic rhinitis: Secondary | ICD-10-CM | POA: Diagnosis not present

## 2022-09-25 DIAGNOSIS — J301 Allergic rhinitis due to pollen: Secondary | ICD-10-CM | POA: Diagnosis not present

## 2022-09-25 DIAGNOSIS — M25561 Pain in right knee: Secondary | ICD-10-CM | POA: Diagnosis not present

## 2022-09-27 DIAGNOSIS — M25561 Pain in right knee: Secondary | ICD-10-CM | POA: Diagnosis not present

## 2022-09-29 DIAGNOSIS — M1711 Unilateral primary osteoarthritis, right knee: Secondary | ICD-10-CM | POA: Diagnosis not present

## 2022-10-02 DIAGNOSIS — M25561 Pain in right knee: Secondary | ICD-10-CM | POA: Diagnosis not present

## 2022-10-03 DIAGNOSIS — J3089 Other allergic rhinitis: Secondary | ICD-10-CM | POA: Diagnosis not present

## 2022-10-03 DIAGNOSIS — J301 Allergic rhinitis due to pollen: Secondary | ICD-10-CM | POA: Diagnosis not present

## 2022-10-04 DIAGNOSIS — M25561 Pain in right knee: Secondary | ICD-10-CM | POA: Diagnosis not present

## 2022-10-06 DIAGNOSIS — M1711 Unilateral primary osteoarthritis, right knee: Secondary | ICD-10-CM | POA: Diagnosis not present

## 2022-10-09 DIAGNOSIS — M25561 Pain in right knee: Secondary | ICD-10-CM | POA: Diagnosis not present

## 2022-10-10 DIAGNOSIS — J301 Allergic rhinitis due to pollen: Secondary | ICD-10-CM | POA: Diagnosis not present

## 2022-10-10 DIAGNOSIS — J3089 Other allergic rhinitis: Secondary | ICD-10-CM | POA: Diagnosis not present

## 2022-10-11 DIAGNOSIS — M25561 Pain in right knee: Secondary | ICD-10-CM | POA: Diagnosis not present

## 2022-10-23 DIAGNOSIS — J3081 Allergic rhinitis due to animal (cat) (dog) hair and dander: Secondary | ICD-10-CM | POA: Diagnosis not present

## 2022-10-23 DIAGNOSIS — J3089 Other allergic rhinitis: Secondary | ICD-10-CM | POA: Diagnosis not present

## 2022-10-23 DIAGNOSIS — J301 Allergic rhinitis due to pollen: Secondary | ICD-10-CM | POA: Diagnosis not present

## 2022-11-07 DIAGNOSIS — J301 Allergic rhinitis due to pollen: Secondary | ICD-10-CM | POA: Diagnosis not present

## 2022-11-07 DIAGNOSIS — J3081 Allergic rhinitis due to animal (cat) (dog) hair and dander: Secondary | ICD-10-CM | POA: Diagnosis not present

## 2022-11-07 DIAGNOSIS — J3089 Other allergic rhinitis: Secondary | ICD-10-CM | POA: Diagnosis not present

## 2022-11-21 DIAGNOSIS — J3089 Other allergic rhinitis: Secondary | ICD-10-CM | POA: Diagnosis not present

## 2022-11-21 DIAGNOSIS — J301 Allergic rhinitis due to pollen: Secondary | ICD-10-CM | POA: Diagnosis not present

## 2022-11-21 DIAGNOSIS — J3081 Allergic rhinitis due to animal (cat) (dog) hair and dander: Secondary | ICD-10-CM | POA: Diagnosis not present

## 2022-12-05 DIAGNOSIS — J301 Allergic rhinitis due to pollen: Secondary | ICD-10-CM | POA: Diagnosis not present

## 2022-12-05 DIAGNOSIS — J3081 Allergic rhinitis due to animal (cat) (dog) hair and dander: Secondary | ICD-10-CM | POA: Diagnosis not present

## 2022-12-05 DIAGNOSIS — J3089 Other allergic rhinitis: Secondary | ICD-10-CM | POA: Diagnosis not present

## 2022-12-13 DIAGNOSIS — J3081 Allergic rhinitis due to animal (cat) (dog) hair and dander: Secondary | ICD-10-CM | POA: Diagnosis not present

## 2022-12-13 DIAGNOSIS — J301 Allergic rhinitis due to pollen: Secondary | ICD-10-CM | POA: Diagnosis not present

## 2022-12-13 DIAGNOSIS — J3089 Other allergic rhinitis: Secondary | ICD-10-CM | POA: Diagnosis not present

## 2022-12-27 DIAGNOSIS — J301 Allergic rhinitis due to pollen: Secondary | ICD-10-CM | POA: Diagnosis not present

## 2022-12-27 DIAGNOSIS — J3089 Other allergic rhinitis: Secondary | ICD-10-CM | POA: Diagnosis not present

## 2023-01-08 DIAGNOSIS — J3081 Allergic rhinitis due to animal (cat) (dog) hair and dander: Secondary | ICD-10-CM | POA: Diagnosis not present

## 2023-01-08 DIAGNOSIS — J301 Allergic rhinitis due to pollen: Secondary | ICD-10-CM | POA: Diagnosis not present

## 2023-01-08 DIAGNOSIS — J3089 Other allergic rhinitis: Secondary | ICD-10-CM | POA: Diagnosis not present

## 2023-01-11 DIAGNOSIS — Z630 Problems in relationship with spouse or partner: Secondary | ICD-10-CM | POA: Diagnosis not present

## 2023-01-11 DIAGNOSIS — F411 Generalized anxiety disorder: Secondary | ICD-10-CM | POA: Diagnosis not present

## 2023-01-15 DIAGNOSIS — J3081 Allergic rhinitis due to animal (cat) (dog) hair and dander: Secondary | ICD-10-CM | POA: Diagnosis not present

## 2023-01-15 DIAGNOSIS — J3089 Other allergic rhinitis: Secondary | ICD-10-CM | POA: Diagnosis not present

## 2023-01-15 DIAGNOSIS — J301 Allergic rhinitis due to pollen: Secondary | ICD-10-CM | POA: Diagnosis not present

## 2023-01-16 DIAGNOSIS — J3089 Other allergic rhinitis: Secondary | ICD-10-CM | POA: Diagnosis not present

## 2023-01-22 DIAGNOSIS — Z630 Problems in relationship with spouse or partner: Secondary | ICD-10-CM | POA: Diagnosis not present

## 2023-01-22 DIAGNOSIS — F411 Generalized anxiety disorder: Secondary | ICD-10-CM | POA: Diagnosis not present

## 2023-01-29 DIAGNOSIS — F411 Generalized anxiety disorder: Secondary | ICD-10-CM | POA: Diagnosis not present

## 2023-01-29 DIAGNOSIS — Z630 Problems in relationship with spouse or partner: Secondary | ICD-10-CM | POA: Diagnosis not present

## 2023-01-31 DIAGNOSIS — J3081 Allergic rhinitis due to animal (cat) (dog) hair and dander: Secondary | ICD-10-CM | POA: Diagnosis not present

## 2023-01-31 DIAGNOSIS — J3089 Other allergic rhinitis: Secondary | ICD-10-CM | POA: Diagnosis not present

## 2023-01-31 DIAGNOSIS — J301 Allergic rhinitis due to pollen: Secondary | ICD-10-CM | POA: Diagnosis not present

## 2023-02-11 DIAGNOSIS — J069 Acute upper respiratory infection, unspecified: Secondary | ICD-10-CM | POA: Diagnosis not present

## 2023-02-11 DIAGNOSIS — J309 Allergic rhinitis, unspecified: Secondary | ICD-10-CM | POA: Diagnosis not present

## 2023-02-11 DIAGNOSIS — R059 Cough, unspecified: Secondary | ICD-10-CM | POA: Diagnosis not present

## 2023-02-12 DIAGNOSIS — J3089 Other allergic rhinitis: Secondary | ICD-10-CM | POA: Diagnosis not present

## 2023-02-12 DIAGNOSIS — J3081 Allergic rhinitis due to animal (cat) (dog) hair and dander: Secondary | ICD-10-CM | POA: Diagnosis not present

## 2023-02-12 DIAGNOSIS — J301 Allergic rhinitis due to pollen: Secondary | ICD-10-CM | POA: Diagnosis not present

## 2023-02-13 DIAGNOSIS — F411 Generalized anxiety disorder: Secondary | ICD-10-CM | POA: Diagnosis not present

## 2023-02-13 DIAGNOSIS — Z630 Problems in relationship with spouse or partner: Secondary | ICD-10-CM | POA: Diagnosis not present

## 2023-02-20 DIAGNOSIS — F411 Generalized anxiety disorder: Secondary | ICD-10-CM | POA: Diagnosis not present

## 2023-02-20 DIAGNOSIS — Z630 Problems in relationship with spouse or partner: Secondary | ICD-10-CM | POA: Diagnosis not present

## 2023-02-27 DIAGNOSIS — F411 Generalized anxiety disorder: Secondary | ICD-10-CM | POA: Diagnosis not present

## 2023-02-27 DIAGNOSIS — Z630 Problems in relationship with spouse or partner: Secondary | ICD-10-CM | POA: Diagnosis not present

## 2023-03-09 DIAGNOSIS — M170 Bilateral primary osteoarthritis of knee: Secondary | ICD-10-CM | POA: Diagnosis not present

## 2023-03-12 DIAGNOSIS — J301 Allergic rhinitis due to pollen: Secondary | ICD-10-CM | POA: Diagnosis not present

## 2023-03-12 DIAGNOSIS — J3081 Allergic rhinitis due to animal (cat) (dog) hair and dander: Secondary | ICD-10-CM | POA: Diagnosis not present

## 2023-03-12 DIAGNOSIS — J3089 Other allergic rhinitis: Secondary | ICD-10-CM | POA: Diagnosis not present

## 2023-03-13 DIAGNOSIS — Z0000 Encounter for general adult medical examination without abnormal findings: Secondary | ICD-10-CM | POA: Diagnosis not present

## 2023-03-13 DIAGNOSIS — Z630 Problems in relationship with spouse or partner: Secondary | ICD-10-CM | POA: Diagnosis not present

## 2023-03-13 DIAGNOSIS — F411 Generalized anxiety disorder: Secondary | ICD-10-CM | POA: Diagnosis not present

## 2023-03-15 DIAGNOSIS — E785 Hyperlipidemia, unspecified: Secondary | ICD-10-CM | POA: Diagnosis not present

## 2023-03-15 DIAGNOSIS — M1009 Idiopathic gout, multiple sites: Secondary | ICD-10-CM | POA: Diagnosis not present

## 2023-03-15 DIAGNOSIS — Z125 Encounter for screening for malignant neoplasm of prostate: Secondary | ICD-10-CM | POA: Diagnosis not present

## 2023-03-15 DIAGNOSIS — Z79899 Other long term (current) drug therapy: Secondary | ICD-10-CM | POA: Diagnosis not present

## 2023-03-19 DIAGNOSIS — J3089 Other allergic rhinitis: Secondary | ICD-10-CM | POA: Diagnosis not present

## 2023-03-19 DIAGNOSIS — J301 Allergic rhinitis due to pollen: Secondary | ICD-10-CM | POA: Diagnosis not present

## 2023-03-19 DIAGNOSIS — R040 Epistaxis: Secondary | ICD-10-CM | POA: Diagnosis not present

## 2023-03-19 DIAGNOSIS — J3081 Allergic rhinitis due to animal (cat) (dog) hair and dander: Secondary | ICD-10-CM | POA: Diagnosis not present

## 2023-03-26 DIAGNOSIS — J3089 Other allergic rhinitis: Secondary | ICD-10-CM | POA: Diagnosis not present

## 2023-03-26 DIAGNOSIS — J3081 Allergic rhinitis due to animal (cat) (dog) hair and dander: Secondary | ICD-10-CM | POA: Diagnosis not present

## 2023-03-26 DIAGNOSIS — J301 Allergic rhinitis due to pollen: Secondary | ICD-10-CM | POA: Diagnosis not present

## 2023-03-27 DIAGNOSIS — Z630 Problems in relationship with spouse or partner: Secondary | ICD-10-CM | POA: Diagnosis not present

## 2023-03-27 DIAGNOSIS — F411 Generalized anxiety disorder: Secondary | ICD-10-CM | POA: Diagnosis not present

## 2023-03-28 DIAGNOSIS — H40013 Open angle with borderline findings, low risk, bilateral: Secondary | ICD-10-CM | POA: Diagnosis not present

## 2023-04-02 DIAGNOSIS — J3089 Other allergic rhinitis: Secondary | ICD-10-CM | POA: Diagnosis not present

## 2023-04-02 DIAGNOSIS — J301 Allergic rhinitis due to pollen: Secondary | ICD-10-CM | POA: Diagnosis not present

## 2023-04-02 DIAGNOSIS — J3081 Allergic rhinitis due to animal (cat) (dog) hair and dander: Secondary | ICD-10-CM | POA: Diagnosis not present

## 2023-04-04 DIAGNOSIS — L814 Other melanin hyperpigmentation: Secondary | ICD-10-CM | POA: Diagnosis not present

## 2023-04-04 DIAGNOSIS — L821 Other seborrheic keratosis: Secondary | ICD-10-CM | POA: Diagnosis not present

## 2023-04-04 DIAGNOSIS — Z08 Encounter for follow-up examination after completed treatment for malignant neoplasm: Secondary | ICD-10-CM | POA: Diagnosis not present

## 2023-04-04 DIAGNOSIS — D225 Melanocytic nevi of trunk: Secondary | ICD-10-CM | POA: Diagnosis not present

## 2023-04-09 DIAGNOSIS — J301 Allergic rhinitis due to pollen: Secondary | ICD-10-CM | POA: Diagnosis not present

## 2023-04-09 DIAGNOSIS — J3081 Allergic rhinitis due to animal (cat) (dog) hair and dander: Secondary | ICD-10-CM | POA: Diagnosis not present

## 2023-04-09 DIAGNOSIS — J3089 Other allergic rhinitis: Secondary | ICD-10-CM | POA: Diagnosis not present

## 2023-04-10 DIAGNOSIS — M1711 Unilateral primary osteoarthritis, right knee: Secondary | ICD-10-CM | POA: Diagnosis not present

## 2023-04-16 DIAGNOSIS — J301 Allergic rhinitis due to pollen: Secondary | ICD-10-CM | POA: Diagnosis not present

## 2023-04-16 DIAGNOSIS — J3089 Other allergic rhinitis: Secondary | ICD-10-CM | POA: Diagnosis not present

## 2023-04-17 DIAGNOSIS — M1711 Unilateral primary osteoarthritis, right knee: Secondary | ICD-10-CM | POA: Diagnosis not present

## 2023-04-24 DIAGNOSIS — M1711 Unilateral primary osteoarthritis, right knee: Secondary | ICD-10-CM | POA: Diagnosis not present

## 2023-04-27 DIAGNOSIS — J301 Allergic rhinitis due to pollen: Secondary | ICD-10-CM | POA: Diagnosis not present

## 2023-04-27 DIAGNOSIS — J3089 Other allergic rhinitis: Secondary | ICD-10-CM | POA: Diagnosis not present

## 2023-04-27 DIAGNOSIS — J3081 Allergic rhinitis due to animal (cat) (dog) hair and dander: Secondary | ICD-10-CM | POA: Diagnosis not present

## 2023-05-02 DIAGNOSIS — J301 Allergic rhinitis due to pollen: Secondary | ICD-10-CM | POA: Diagnosis not present

## 2023-05-02 DIAGNOSIS — J3081 Allergic rhinitis due to animal (cat) (dog) hair and dander: Secondary | ICD-10-CM | POA: Diagnosis not present

## 2023-05-02 DIAGNOSIS — J3089 Other allergic rhinitis: Secondary | ICD-10-CM | POA: Diagnosis not present

## 2023-05-25 DIAGNOSIS — J301 Allergic rhinitis due to pollen: Secondary | ICD-10-CM | POA: Diagnosis not present

## 2023-05-25 DIAGNOSIS — J3089 Other allergic rhinitis: Secondary | ICD-10-CM | POA: Diagnosis not present

## 2023-05-25 DIAGNOSIS — J3081 Allergic rhinitis due to animal (cat) (dog) hair and dander: Secondary | ICD-10-CM | POA: Diagnosis not present

## 2023-05-31 DIAGNOSIS — D492 Neoplasm of unspecified behavior of bone, soft tissue, and skin: Secondary | ICD-10-CM | POA: Diagnosis not present

## 2023-05-31 DIAGNOSIS — L821 Other seborrheic keratosis: Secondary | ICD-10-CM | POA: Diagnosis not present

## 2023-05-31 DIAGNOSIS — L538 Other specified erythematous conditions: Secondary | ICD-10-CM | POA: Diagnosis not present

## 2023-06-04 DIAGNOSIS — R1030 Lower abdominal pain, unspecified: Secondary | ICD-10-CM | POA: Diagnosis not present

## 2023-06-04 DIAGNOSIS — M109 Gout, unspecified: Secondary | ICD-10-CM | POA: Diagnosis not present

## 2023-06-18 DIAGNOSIS — J3089 Other allergic rhinitis: Secondary | ICD-10-CM | POA: Diagnosis not present

## 2023-06-18 DIAGNOSIS — J301 Allergic rhinitis due to pollen: Secondary | ICD-10-CM | POA: Diagnosis not present

## 2023-06-18 DIAGNOSIS — J3081 Allergic rhinitis due to animal (cat) (dog) hair and dander: Secondary | ICD-10-CM | POA: Diagnosis not present

## 2023-06-19 DIAGNOSIS — R945 Abnormal results of liver function studies: Secondary | ICD-10-CM | POA: Diagnosis not present

## 2023-06-23 DIAGNOSIS — R051 Acute cough: Secondary | ICD-10-CM | POA: Diagnosis not present

## 2023-07-09 DIAGNOSIS — J301 Allergic rhinitis due to pollen: Secondary | ICD-10-CM | POA: Diagnosis not present

## 2023-07-09 DIAGNOSIS — J3089 Other allergic rhinitis: Secondary | ICD-10-CM | POA: Diagnosis not present

## 2023-07-09 DIAGNOSIS — J3081 Allergic rhinitis due to animal (cat) (dog) hair and dander: Secondary | ICD-10-CM | POA: Diagnosis not present

## 2023-07-10 DIAGNOSIS — R109 Unspecified abdominal pain: Secondary | ICD-10-CM | POA: Diagnosis not present

## 2023-07-10 DIAGNOSIS — M7918 Myalgia, other site: Secondary | ICD-10-CM | POA: Diagnosis not present

## 2023-07-23 DIAGNOSIS — J3089 Other allergic rhinitis: Secondary | ICD-10-CM | POA: Diagnosis not present

## 2023-07-23 DIAGNOSIS — J301 Allergic rhinitis due to pollen: Secondary | ICD-10-CM | POA: Diagnosis not present

## 2023-07-23 DIAGNOSIS — J3081 Allergic rhinitis due to animal (cat) (dog) hair and dander: Secondary | ICD-10-CM | POA: Diagnosis not present

## 2023-08-20 DIAGNOSIS — J3089 Other allergic rhinitis: Secondary | ICD-10-CM | POA: Diagnosis not present

## 2023-08-20 DIAGNOSIS — J301 Allergic rhinitis due to pollen: Secondary | ICD-10-CM | POA: Diagnosis not present

## 2023-08-20 DIAGNOSIS — J3081 Allergic rhinitis due to animal (cat) (dog) hair and dander: Secondary | ICD-10-CM | POA: Diagnosis not present

## 2023-09-24 DIAGNOSIS — J301 Allergic rhinitis due to pollen: Secondary | ICD-10-CM | POA: Diagnosis not present

## 2023-09-24 DIAGNOSIS — J3081 Allergic rhinitis due to animal (cat) (dog) hair and dander: Secondary | ICD-10-CM | POA: Diagnosis not present

## 2023-09-24 DIAGNOSIS — J3089 Other allergic rhinitis: Secondary | ICD-10-CM | POA: Diagnosis not present

## 2023-10-01 DIAGNOSIS — J301 Allergic rhinitis due to pollen: Secondary | ICD-10-CM | POA: Diagnosis not present

## 2023-10-01 DIAGNOSIS — J3089 Other allergic rhinitis: Secondary | ICD-10-CM | POA: Diagnosis not present

## 2023-10-01 DIAGNOSIS — R040 Epistaxis: Secondary | ICD-10-CM | POA: Diagnosis not present

## 2023-10-22 DIAGNOSIS — J3089 Other allergic rhinitis: Secondary | ICD-10-CM | POA: Diagnosis not present

## 2023-10-22 DIAGNOSIS — J301 Allergic rhinitis due to pollen: Secondary | ICD-10-CM | POA: Diagnosis not present

## 2023-10-22 DIAGNOSIS — J3081 Allergic rhinitis due to animal (cat) (dog) hair and dander: Secondary | ICD-10-CM | POA: Diagnosis not present

## 2023-11-22 DIAGNOSIS — J3089 Other allergic rhinitis: Secondary | ICD-10-CM | POA: Diagnosis not present

## 2023-11-22 DIAGNOSIS — J301 Allergic rhinitis due to pollen: Secondary | ICD-10-CM | POA: Diagnosis not present

## 2023-11-22 DIAGNOSIS — J3081 Allergic rhinitis due to animal (cat) (dog) hair and dander: Secondary | ICD-10-CM | POA: Diagnosis not present

## 2024-03-27 DIAGNOSIS — Z125 Encounter for screening for malignant neoplasm of prostate: Secondary | ICD-10-CM | POA: Diagnosis not present

## 2024-03-27 DIAGNOSIS — R7989 Other specified abnormal findings of blood chemistry: Secondary | ICD-10-CM | POA: Diagnosis not present

## 2024-03-27 DIAGNOSIS — E785 Hyperlipidemia, unspecified: Secondary | ICD-10-CM | POA: Diagnosis not present

## 2024-03-27 DIAGNOSIS — Z79899 Other long term (current) drug therapy: Secondary | ICD-10-CM | POA: Diagnosis not present

## 2024-03-27 DIAGNOSIS — M1009 Idiopathic gout, multiple sites: Secondary | ICD-10-CM | POA: Diagnosis not present

## 2024-03-27 DIAGNOSIS — M109 Gout, unspecified: Secondary | ICD-10-CM | POA: Diagnosis not present

## 2024-03-31 DIAGNOSIS — M1009 Idiopathic gout, multiple sites: Secondary | ICD-10-CM | POA: Diagnosis not present

## 2024-03-31 DIAGNOSIS — E785 Hyperlipidemia, unspecified: Secondary | ICD-10-CM | POA: Diagnosis not present

## 2024-03-31 DIAGNOSIS — R5383 Other fatigue: Secondary | ICD-10-CM | POA: Diagnosis not present

## 2024-03-31 DIAGNOSIS — Z0000 Encounter for general adult medical examination without abnormal findings: Secondary | ICD-10-CM | POA: Diagnosis not present

## 2024-04-07 DIAGNOSIS — Q278 Other specified congenital malformations of peripheral vascular system: Secondary | ICD-10-CM | POA: Diagnosis not present

## 2024-04-07 DIAGNOSIS — L814 Other melanin hyperpigmentation: Secondary | ICD-10-CM | POA: Diagnosis not present

## 2024-04-07 DIAGNOSIS — D1801 Hemangioma of skin and subcutaneous tissue: Secondary | ICD-10-CM | POA: Diagnosis not present

## 2024-04-07 DIAGNOSIS — L821 Other seborrheic keratosis: Secondary | ICD-10-CM | POA: Diagnosis not present
